# Patient Record
Sex: Female | Born: 1955 | Race: White | Hispanic: No | Marital: Married
Health system: Midwestern US, Community
[De-identification: ages and names within clinical notes are randomized; demographics above are authoritative.]

## PROBLEM LIST (undated history)

## (undated) DIAGNOSIS — K219 Gastro-esophageal reflux disease without esophagitis: Secondary | ICD-10-CM

## (undated) DIAGNOSIS — N951 Menopausal and female climacteric states: Secondary | ICD-10-CM

## (undated) DIAGNOSIS — I10 Essential (primary) hypertension: Secondary | ICD-10-CM

## (undated) DIAGNOSIS — Z0000 Encounter for general adult medical examination without abnormal findings: Secondary | ICD-10-CM

## (undated) DIAGNOSIS — Z202 Contact with and (suspected) exposure to infections with a predominantly sexual mode of transmission: Secondary | ICD-10-CM

## (undated) DIAGNOSIS — B9689 Other specified bacterial agents as the cause of diseases classified elsewhere: Secondary | ICD-10-CM

## (undated) DIAGNOSIS — M25512 Pain in left shoulder: Secondary | ICD-10-CM

## (undated) DIAGNOSIS — K529 Noninfective gastroenteritis and colitis, unspecified: Secondary | ICD-10-CM

## (undated) DIAGNOSIS — J019 Acute sinusitis, unspecified: Secondary | ICD-10-CM

## (undated) DIAGNOSIS — Q211 Atrial septal defect, unspecified: Secondary | ICD-10-CM

## (undated) DIAGNOSIS — R233 Spontaneous ecchymoses: Secondary | ICD-10-CM

## (undated) DIAGNOSIS — L0291 Cutaneous abscess, unspecified: Secondary | ICD-10-CM

## (undated) DIAGNOSIS — Z1231 Encounter for screening mammogram for malignant neoplasm of breast: Secondary | ICD-10-CM

## (undated) DIAGNOSIS — E876 Hypokalemia: Secondary | ICD-10-CM

---

## 2009-05-22 LAB — LIPID PANEL
Chol/HDL Ratio: 3.5
Cholesterol, Total: 235
HDL: 68 mg/dl
LDL Calculated: 133 mg/dL (ref 0–160)
Triglycerides: 170 mg/dL — AB (ref 40–160)

## 2010-01-24 NOTE — Progress Notes (Signed)
Subjective:      Patient ID: Deborah Crosby is a 54 y.o. female.    Knee Pain   Incident onset: many years ago. There was no injury mechanism. The pain is present in the left knee and right knee. The quality of the pain is described as aching. Pain severity now: moderate to severe. The pain has been intermittent since onset. She reports no foreign bodies present. Exacerbated by: activity. She has tried ice for the symptoms. The treatment provided mild relief.   Hip Pain   Incident onset: many years ago. There was no injury mechanism. The pain is present in the right hip. The quality of the pain is described as aching. Pain severity now: moderate to severe. The pain has been intermittent since onset. She reports no foreign bodies present. Exacerbated by: activity. She has tried ice and heat for the symptoms. The treatment provided mild relief.       Review of Systems    REVIEW OF SYSTEMS:   Patient seen today for exam.  Denies any problems with hearing, headaches or vision.  Denies any shortness of breath, chest pain, nausea or vomiting.  No black stool, no blood in the stool.  No heartburn.  Denies any problems with constipation or diarrhea either.  No dysuria type symptoms.  Libido is normal.  Reports no musculoskeletal or neurological concerns    Objective:   Physical Exam  O:  Alert and active female in no acute distress  HEENT:  TMs clear. Nares clear, no drainage noted.  Neck supple   HEART:  RRR without murmur  LUNGS:  Clear to auscultation   ABDOMEN:  Soft x4.  Bowel sounds positive.  Negative tenderness, guarding or rebound.    EXTR:  Without edema.    Neurologic exam unremarkable.  DTRs in upper and lower extremities within normal limits.  Full strength.     Assessment:      Encounter Diagnoses   Name Primary?   ??? Hypertension    ??? GERD (gastroesophageal reflux disease)    ??? Migraine headache    ??? Other screening mammogram           Plan:      Medical Decision Making:  Orders Placed This Encounter    Procedures   ??? Mammography digital screen bilateral     Order Specific Question:  Reason for exam:     Answer:  screen

## 2010-03-07 MED ORDER — ATENOLOL-CHLORTHALIDONE 50-25 MG PO TABS
50-25 MG | ORAL_TABLET | Freq: Every day | ORAL | Status: DC
Start: 2010-03-07 — End: 2010-12-26

## 2010-03-07 MED ORDER — SIMVASTATIN 40 MG PO TABS
40 MG | ORAL_TABLET | Freq: Every day | ORAL | Status: DC
Start: 2010-03-07 — End: 2010-12-26

## 2010-03-07 MED ORDER — CYCLOBENZAPRINE HCL 10 MG PO TABS
10 MG | ORAL_TABLET | Freq: Three times a day (TID) | ORAL | Status: DC | PRN
Start: 2010-03-07 — End: 2010-12-26

## 2010-03-07 MED ORDER — SUMATRIPTAN SUCCINATE 100 MG PO TABS
100 | ORAL_TABLET | ORAL | Status: DC | PRN
Start: 2010-03-07 — End: 2010-12-26

## 2010-03-07 MED ORDER — DICLOFENAC SODIUM 75 MG PO TBEC
75 MG | ORAL_TABLET | Freq: Two times a day (BID) | ORAL | Status: DC
Start: 2010-03-07 — End: 2012-07-16

## 2010-03-07 MED ORDER — TRAMADOL HCL 50 MG PO TABS
50 MG | ORAL_TABLET | ORAL | Status: DC
Start: 2010-03-07 — End: 2010-12-26

## 2010-03-07 MED ORDER — METHYLPREDNISOLONE (PAK) 4 MG PO TABS
4 MG | ORAL_TABLET | ORAL | Status: AC
Start: 2010-03-07 — End: 2010-03-13

## 2010-03-07 NOTE — Progress Notes (Signed)
Subjective:      Patient ID: Deborah Crosby is a 54 y.o. female.    Breathing Problem  She complains of difficulty breathing. Primary symptoms comments: Pain in upper back when taking in deep breath. This is a new problem. Episode onset: 3 days ago. The problem occurs constantly. The problem has been unchanged. Her symptoms are aggravated by lying down.     HAD THIS IN PAST AND WAS DIAGNOSED WITH PLEURISY.  No pain with rest or shallow breathing.  Worse with cough and deep breath.      Taking medication as discussed. Refills given.  Review of Systems    Objective:   Physical Exam   HENT:   Nose: Nose normal.   Mouth/Throat: Oropharynx is clear and moist.   Eyes: Conjunctivae are normal. Pupils are equal, round, and reactive to light.   Neck: Neck supple.   Cardiovascular: Normal rate, regular rhythm and normal heart sounds.    No murmur heard.  Pulmonary/Chest: Breath sounds normal. She has no wheezes. She has no rales. She exhibits tenderness.        Tender left mid back radiating bilaterally but most on left side.  No rash.     Abdominal: Soft. She exhibits no mass. No tenderness.   Musculoskeletal: She exhibits no edema.        Lumbar back: She exhibits decreased range of motion and pain. She exhibits no bony tenderness.   Lymphadenopathy:     She has no cervical adenopathy.       Assessment:      Encounter Diagnoses   Name Primary?   ??? Hypertension    ??? GERD (gastroesophageal reflux disease)    ??? Migraine headache    ??? Pleurisy Yes           Plan:      Orders Placed This Encounter   Medications   ??? simvastatin (ZOCOR) 40 MG tablet     Sig: Take 1 tablet by mouth daily.     Dispense:  90 tablet     Refill:  2   ??? sumatriptan (IMITREX) 100 MG tablet     Sig: Take 1 tablet by mouth as needed.     Dispense:  27 tablet     Refill:  2   ??? cyclobenzaprine (FLEXERIL) 10 MG tablet     Sig: Take 1 tablet by mouth 3 times daily as needed.     Dispense:  270 tablet     Refill:  2   ??? diclofenac (VOLTAREN) 75 MG EC tablet      Sig: Take 1 tablet by mouth 2 times daily.     Dispense:  180 tablet     Refill:  2   ??? tramadol (ULTRAM) 50 MG tablet     Sig: 1-2 po q 6-8?? prn     Dispense:  360 tablet     Refill:  2   ??? atenolol-chlorthalidone (TENORETIC) 50-25 MG per tablet     Sig: Take 1 tablet by mouth daily.     Dispense:  90 tablet     Refill:  2   ??? methylPREDNIsolone (MEDROL DOSPACK) 4 MG tablet     Sig: Take as directed per package instructions     Dispense:  21 tablet     Refill:  0     Take medication as prescribed.  Avoid activity that exacerbates symptoms.   Call or return to office as needed if these symptoms worsen or fail  to improve as anticipated.  Follow up as instructed.  Call if any problems.   CPE in the spring.

## 2010-03-29 MED ORDER — HYDROCODONE-ACETAMINOPHEN 5-500 MG PO TABS
5-500 MG | ORAL_TABLET | Freq: Four times a day (QID) | ORAL | Status: AC | PRN
Start: 2010-03-29 — End: 2010-05-08

## 2010-03-29 NOTE — Telephone Encounter (Signed)
Prescription called to pharmacy voicemail  Patient aware.

## 2010-03-29 NOTE — Telephone Encounter (Signed)
Last seen 03-07-10 for knee and hip pain. Was given Medrol dose pak , anti inflammatory and muscle relaxer and does not seem to be helping. Can you Rx some type of pain medication ?

## 2010-11-26 MED ORDER — PHENTERMINE HCL 37.5 MG PO CAPS
37.5 MG | ORAL_CAPSULE | Freq: Every morning | ORAL | Status: DC
Start: 2010-11-26 — End: 2010-12-26

## 2010-11-26 NOTE — Patient Instructions (Addendum)
Thank you for enrolling in MyChart. Please follow the instructions below to securely access your online medical record. MyChart allows you to send messages to your doctor, view your test results, renew your prescriptions, schedule appointments, and more.     How Do I Sign Up?  1. In your Internet browser, go to [email or web address]  2. Click on the Sign Up Now link in the Sign In box. You will see the New Member Sign Up page.  3. Enter your MyChart Access Code exactly as it appears below. You will not need to use this code after you've completed the sign-up process. If you do not sign up before the expiration date, you must request a new code.  MyChart Access Code: [email or web address]  Expires: 01/25/2011 10:43 AM    4. Enter your Social Security Number ([id number]) and Date of Birth (mm/dd/yyyy) as indicated and click Submit. You will be taken to the next sign-up page.  5. Create a MyChart ID. This will be your MyChart login ID and cannot be changed, so think of one that is secure and easy to remember.  6. Create a MyChart password. You can change your password at any time.  7. Enter your Password Reset Question and Answer. This can be used at a later time if you forget your password.   8. Enter your e-mail address. You will receive e-mail notification when new information is available in MyChart.  9. Click Sign Up. You can now view your medical record.     Additional Information  If you have questions, please contact your physician practice where you receive care. Remember, MyChart is NOT to be used for urgent needs. For medical emergencies, dial 911.      1800 Calorie Daily Diet Sample  Breakfast  1 cup Milk (skim)  1 c Strawberries (fresh)  1 Bagel (whole wheat or bran)  2 tbsp Cream Cheese (fat free)  0.1 c raisins (seedless)    Snack #1  1 medium Apple  0.25 cup whole Almonds    Lunch 1 cup Cranberry Juice (light)  1 Whole Wheat Pita  3 oz. Light Tuna (in water)  1 tbsp Mayo (light)  0.25 cup  sliced Tomato  0.2 cup Spinach Leaves (raw)  1 medium Orange    Snack #2  1 cup cauliflower (fresh, raw)  1 tbsp veggie dip     Dinner  4 oz. Chicken Breast (white meat)  1 tbsp Lemon Juice (fresh)  1 tbsp Ground Pepper (fresh)  1 small Sweet Potato (baked)  1 small Salad         - 0.2 cup cucumber        - 0.2 cup diced tomato        - 0.2 cup diced sweet peppers        - 1 tbsp lemon juice        - 1 tsp parsley (dried)  1 small Whole Wheat Roll    Snack #3   1 cup Plain, Non fat Yogurt  1 cup Blueberries  [email or web address]/meal_plans/

## 2010-11-26 NOTE — Progress Notes (Signed)
Subjective:      Patient ID: Deborah Crosby is a 55 y.o. female.    HPI  Presents today for follow-up on her lesion on her face  Patient was referred to Dr.Warner to have it removed, had a surgery date set then that office called stating that he won't remove it if it is going to leave a scar  Patient would like to know the next step to have it removed   Taking current medications which were reviewed.  Problem list discussed.    Overall doing well.  Has 3 new problem / question. Patient had a cramp in her right calve last week that woke her up, since then she is still having pain/ tightness in her calve. Unable to completely straighten it.     Patient would like to discuss something for weight loss.    Patient would like to discuss Imitrex, states she has been on it for years and is unsure if it helps anymore.      Review of SystemsDenies fever, chills, chest pain, and shortness of breath      Objective:   Physical Exam   HENT:   Nose: Nose normal.   Mouth/Throat: Oropharynx is clear and moist.   Eyes: Conjunctivae are normal. Pupils are equal, round, and reactive to light.   Neck: Neck supple.   Cardiovascular: Normal rate, regular rhythm and normal heart sounds.    No murmur heard.  Pulmonary/Chest: Breath sounds normal. She has no wheezes.   Abdominal: Soft. She exhibits no mass. There is no tenderness.   Musculoskeletal: She exhibits no edema and no tenderness.   Lymphadenopathy:     She has no cervical adenopathy.   Skin: No rash noted.        2 cm seb keratosis lesion       Assessment:      Encounter Diagnoses   Name Primary?   . Need for influenza vaccination    . Hypertension    . GERD (gastroesophageal reflux disease)    . Seborrheic keratosis Yes   . Obesity, unspecified          Plan:      Orders Placed This Encounter   Procedures   . Flu Vaccine greater than or = 57yo IM      Orders Placed This Encounter   Medications   . DISCONTD: phentermine (ADIPEX-P) 37.5 MG capsule     Sig: Take 1 capsule by mouth  every morning for 30 days.     Dispense:  30 capsule     Refill:  0     Take medication as prescribed.  Risks and benefits of adipex discussed in great detail  Will hold off on face lesion.  OTC QUININE and stretching for leg cramps  Discontinue imitrex  The importance of a good diet and exercise regimen discussed.  Follow up as instructed.  Call if any problems.

## 2010-12-26 MED ORDER — TRAMADOL HCL 50 MG PO TABS
50 MG | ORAL_TABLET | ORAL | Status: DC
Start: 2010-12-26 — End: 2012-05-03

## 2010-12-26 MED ORDER — DICLOFENAC SODIUM 75 MG PO TBEC
75 MG | ORAL_TABLET | Freq: Two times a day (BID) | ORAL | Status: DC
Start: 2010-12-26 — End: 2011-02-07

## 2010-12-26 MED ORDER — CYCLOBENZAPRINE HCL 10 MG PO TABS
10 MG | ORAL_TABLET | Freq: Three times a day (TID) | ORAL | Status: DC | PRN
Start: 2010-12-26 — End: 2012-07-16

## 2010-12-26 MED ORDER — ATENOLOL-CHLORTHALIDONE 50-25 MG PO TABS
50-25 MG | ORAL_TABLET | Freq: Every day | ORAL | Status: DC
Start: 2010-12-26 — End: 2011-11-24

## 2010-12-26 MED ORDER — SUMATRIPTAN SUCCINATE 100 MG PO TABS
100 | ORAL_TABLET | ORAL | Status: DC | PRN
Start: 2010-12-26 — End: 2012-03-24

## 2010-12-26 MED ORDER — PHENTERMINE HCL 37.5 MG PO CAPS
37.5 MG | ORAL_CAPSULE | Freq: Every morning | ORAL | Status: AC
Start: 2010-12-26 — End: 2011-01-25

## 2010-12-26 NOTE — Progress Notes (Signed)
In for weight and BP check for second month of Adipex.  Down 9 lbs.  Also needs refills on all meds because now has to mail away to [facility].

## 2011-02-07 MED ORDER — BETAMETHASONE DIPROPIONATE AUG 0.05 % EX CREA
0.05 % | CUTANEOUS | Status: AC
Start: 2011-02-07 — End: 2011-03-09

## 2011-02-07 MED ORDER — PHENTERMINE HCL 37.5 MG PO CAPS
37.5 MG | ORAL_CAPSULE | Freq: Every morning | ORAL | Status: AC
Start: 2011-02-07 — End: 2011-03-09

## 2011-02-07 NOTE — Progress Notes (Signed)
Subjective:      Patient ID: Deborah Crosby is a 55 y.o. female.    HPI  Presents today for a follow-up on weight loss.   Last visit weight was 173lbs.  She has lost 9 lbs.   Taking current medications which were reviewed.  Problem list discussed.    Overall doing well.  Has 2 new problem / question.    States she has lump on the top of her head.   It has been present x 1 week.   There is a soreness to it.     Also has skin peeling on her left foot x 1 month.  There is a mild pain if the skin catches on something.   Has tried lotions, and anti-fungal cream with no relief.       Has no other questions/concerns.       Review of SystemsDenies fever, chills, chest pain, and shortness of breath    No Known Allergies  Outpatient Encounter Prescriptions as of 02/07/2011   Medication Sig Dispense Refill   . phentermine (ADIPEX-P) 37.5 MG capsule Take 1 capsule by mouth every morning for 30 days.  30 capsule  0   . augmented betamethasone dipropionate (DIPROLENE AF) 0.05 % cream Apply topically 2 times daily.  60 g  1   . SUMAtriptan (IMITREX) 100 MG tablet Take 1 tablet by mouth as needed.  27 tablet  2   . atenolol-chlorthalidone (TENORETIC) 50-25 MG per tablet Take 1 tablet by mouth daily.  90 tablet  2   . cyclobenzaprine (FLEXERIL) 10 MG tablet Take 1 tablet by mouth 3 times daily as needed.  270 tablet  2   . traMADol (ULTRAM) 50 MG tablet 1-2 po q 6-8 prn  360 tablet  2   . diclofenac (VOLTAREN) 75 MG EC tablet Take 1 tablet by mouth 2 times daily.  180 tablet  2   . Lansoprazole (PREVACID PO) Take 30 mg by mouth daily.         . Multiple Vitamin (MULTIVITAMIN PO) Take  by mouth.         . DISCONTD: diclofenac (VOLTAREN) 75 MG EC tablet Take 1 tablet by mouth 2 times daily.  180 tablet  2     History     Social History   . Marital Status: Married     Spouse Name: Safiyya Kosmalski     Number of Children: 2   . 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  Smoking status: Never Smoker    . Smokeless tobacco: Never Used   . Alcohol Use: No   . Drug Use: No   . Sexually Active: Not on file     Other Topics Concern   . Not on file     Social History Narrative   . No narrative on file     Family History   Problem Relation Age of Onset   . Cancer Mother      breast   . Heart Disease Father    . Cancer Father      colon     Past Medical History   Diagnosis Date   . Hypertension    . GERD (gastroesophageal reflux disease)    . Migraine headache    . Gout    . Migraine headache  Past Surgical History   Procedure Date   . Appendectomy    . Tonsillectomy    . Colonoscopy 09/21/2009   . Hysterectomy      partial       Objective:   Physical Exam   Eyes: Conjunctivae are normal. Pupils are equal, round, and reactive to light.   Neck: Neck supple.   Cardiovascular: Normal rate, regular rhythm and normal heart sounds.    No murmur heard.  Pulmonary/Chest: Breath sounds normal. She has no wheezes.   Abdominal: Soft. She exhibits no mass. There is no tenderness.   Musculoskeletal: She exhibits no edema.   Lymphadenopathy:     She has no cervical adenopathy.       Assessment:      Encounter Diagnoses   Name Primary?   . Obesity, unspecified Yes   . Hypertension    . Eczema            Plan:         Orders Placed This Encounter   Medications   . phentermine (ADIPEX-P) 37.5 MG capsule     Sig: Take 1 capsule by mouth every morning for 30 days.     Dispense:  30 capsule     Refill:  0     BMI: 27.36   . augmented betamethasone dipropionate (DIPROLENE AF) 0.05 % cream     Sig: Apply topically 2 times daily.     Dispense:  60 g     Refill:  1     Long talk.  Take medication as prescribed.   The importance of a good diet and exercise regimen discussed.  Need to continue diet to goal weight of 150 lbs discussed  Health maintenance issues reviewed and discussed with recommendations made  Follow up as instructed.  Call if any problems.

## 2011-05-29 MED ORDER — PREDNISONE 10 MG PO TABS
10 MG | ORAL_TABLET | ORAL | Status: AC
Start: 2011-05-29 — End: 2011-06-08

## 2011-05-29 NOTE — Progress Notes (Signed)
Subjective:      Patient ID: Deborah Crosby is a 56 y.o. female.    Wrist Pain   Chronicity: Pain in right wrist, for the last 4 days. There has been no history of extremity trauma. The problem has been unchanged. The quality of the pain is described as aching (when bending wrist the wrong way she feels a sharp pain). The pain is moderate. Associated symptoms include an inability to bear weight, a limited range of motion (right wrist and left shoulder) and stiffness (left shoulder). Pertinent negatives include no fever, itching, joint locking, joint swelling, numbness or tingling. She has tried cold (Ibuprofen) for the symptoms.   Shoulder Pain   This is a new (left shoulder pain, since this morning) problem. The problem has been unchanged. The quality of the pain is described as aching. Associated symptoms include an inability to bear weight, a limited range of motion (right wrist and left shoulder) and stiffness (left shoulder). Pertinent negatives include no fever, itching, joint locking, joint swelling, numbness or tingling. Treatments tried: Ibuprofen. The treatment provided mild relief.   Hip Pain   Incident onset: Pain in her left hip, reports no injury. The quality of the pain is described as aching and cramping. The pain is moderate. Associated symptoms include an inability to bear weight and a loss of motion. Pertinent negatives include no loss of sensation, muscle weakness, numbness or tingling. Treatments tried: Ibuprofen. The treatment provided mild relief.       Review of Systems   Constitutional: Negative for fever.   Musculoskeletal: Positive for stiffness (left shoulder).   Skin: Negative for itching.   Neurological: Negative for tingling and numbness.     No Known Allergies  Outpatient Encounter Prescriptions as of 05/29/2011   Medication Sig Dispense Refill   . predniSONE (DELTASONE) 10 MG tablet Take 6 tabs x 6 days, then 5 x 1, 4 x 1, 3 x 1, 2 x 1, 1 x 1  51 tablet  0   . SUMAtriptan (IMITREX)  100 MG tablet Take 1 tablet by mouth as needed.  27 tablet  2   . atenolol-chlorthalidone (TENORETIC) 50-25 MG per tablet Take 1 tablet by mouth daily.  90 tablet  2   . cyclobenzaprine (FLEXERIL) 10 MG tablet Take 1 tablet by mouth 3 times daily as needed.  270 tablet  2   . traMADol (ULTRAM) 50 MG tablet 1-2 po q 6-8 prn  360 tablet  2   . diclofenac (VOLTAREN) 75 MG EC tablet Take 1 tablet by mouth 2 times daily.  180 tablet  2   . Lansoprazole (PREVACID PO) Take 30 mg by mouth daily.         . Multiple Vitamin (MULTIVITAMIN PO) Take  by mouth.           No facility-administered encounter medications on file as of 05/29/2011.     History     Social History   . Marital Status: Married     Spouse Name: Nikeshia Keetch     Number of Children: 2   . 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 Smoking status: Never Smoker    . Smokeless tobacco: Never Used   . Alcohol Use: No   . Drug Use: No   . Sexually Active: Not on file  Other Topics Concern   . Not on file     Social History Narrative   . No narrative on file     Family History   Problem Relation Age of Onset   . Cancer Mother      breast   . Heart Disease Father    . Cancer Father      colon     Past Medical History   Diagnosis Date   . Hypertension    . GERD (gastroesophageal reflux disease)    . Migraine headache    . Gout    . Migraine headache      Past Surgical History   Procedure Laterality Date   . Appendectomy     . Tonsillectomy     . Colonoscopy  09/21/2009   . Hysterectomy       partial       Objective:   Physical Exam   Constitutional: She is oriented to person, place, and time. She appears well-developed and well-nourished.   HENT:   Head: Normocephalic.   Mouth/Throat: Oropharynx is clear and moist.   Eyes: Conjunctivae and EOM are normal. Pupils are equal, round, and reactive to light.   Neck: Normal range of motion. Neck supple. No thyromegaly present.   Cardiovascular: Normal  rate, regular rhythm and intact distal pulses.  Exam reveals no gallop and no friction rub.    No murmur heard.  Pulmonary/Chest: Effort normal and breath sounds normal.   Abdominal: Soft. Bowel sounds are normal.   Musculoskeletal:        Left shoulder: She exhibits decreased range of motion and tenderness.        Right wrist: She exhibits decreased range of motion and tenderness.        Lumbar back: She exhibits tenderness and spasm.   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 Injured right wrist getting out of her truck and so she was using her left hand fo BP. Increased pain since. Right wrist has increased pain with flex/ext and tender over the dorsl radius. No snuff box pain. X-ray neg for fx.   Neurological: She is alert and oriented to person, place, and time.   Skin: Skin is warm and dry.   Psychiatric: She has a normal mood and affect. Her behavior is normal.       Assessment:      Encounter Diagnoses   Name Primary?   . Pain in right wrist Yes   . Left shoulder pain    . Left hip pain    . Tendonitis of wrist, right    . Low back pain             Plan:      Medical Decision Making:  Orders Placed This Encounter   Procedures   . XR Wrist Right Standard     Order Specific Question:  Reason for exam:     Answer:  wrist pain      Orders Placed This Encounter   Medications   . predniSONE (DELTASONE) 10 MG tablet     Sig: Take 6 tabs x 6 days, then 5 x 1, 4 x 1, 3 x 1, 2 x 1, 1 x 1     Dispense:  51 tablet     Refill:  0

## 2011-06-16 NOTE — Progress Notes (Signed)
Subjective:      Patient ID: Deborah Crosby is a 56 y.o. female.    Hip Pain   Incident onset: over 1 month ago. Injury mechanism: a slipping injury. The pain is present in the left hip. The quality of the pain is described as aching and shooting. The pain has been fluctuating since onset. The symptoms are aggravated by movement (certain positions). She has tried NSAIDs (oral narcotics, oral steroids) for the symptoms. The treatment provided mild relief.   Shoulder Pain   The pain is present in the left shoulder. This is a recurrent problem. Episode onset: over 1 month ago. There has been a history of trauma. The problem occurs intermittently. The problem has been waxing and waning. The quality of the pain is described as aching and sharp. The symptoms are aggravated by activity (certain positions). She has tried oral narcotics and NSAIDS (oral steroids) for the symptoms. The treatment provided mild relief.   Wrist Pain   This is a recurrent problem. Episode onset: over 1 month ago. There has been a history of trauma. The problem occurs intermittently. The problem has been waxing and waning. The quality of the pain is described as aching. The symptoms are aggravated by activity (certain movements). She has tried oral narcotics and NSAIDS (oral steroids) for the symptoms. The treatment provided mild relief.     Patient saw Dr. Wynetta Fines for this 2 weeks ago    Review of Systems  Patient seen today for exam.  Denies any problems with hearing, headaches or vision.  Denies any shortness of breath, chest pain, nausea or vomiting.  No black stool, no blood in the stool.  No heartburn.  Denies any problems with constipation or diarrhea either.  No dysuria type symptoms.  Libido is normal.  Reports no musculoskeletal or neurological concerns    No Known Allergies  Outpatient Encounter Prescriptions as of 06/16/2011   Medication Sig Dispense Refill   . SUMAtriptan (IMITREX) 100 MG tablet Take 1 tablet by mouth as needed.  27  tablet  2   . atenolol-chlorthalidone (TENORETIC) 50-25 MG per tablet Take 1 tablet by mouth daily.  90 tablet  2   . cyclobenzaprine (FLEXERIL) 10 MG tablet Take 1 tablet by mouth 3 times daily as needed.  270 tablet  2   . traMADol (ULTRAM) 50 MG tablet 1-2 po q 6-8 prn  360 tablet  2   . diclofenac (VOLTAREN) 75 MG EC tablet Take 1 tablet by mouth 2 times daily.  180 tablet  2   . Lansoprazole (PREVACID PO) Take 30 mg by mouth daily.         . Multiple Vitamin (MULTIVITAMIN PO) Take  by mouth.           No facility-administered encounter medications on file as of 06/16/2011.     History     Social History   . Marital Status: Married     Spouse Name: Tabbatha Bordelon     Number of Children: 2   . 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 Smoking status: Never Smoker    . Smokeless tobacco: Never Used   . Alcohol Use: No   . Drug Use: No   . Sexually Active: Not on file     Other Topics Concern   . Not on file  Social History Narrative   . No narrative on file     Family History   Problem Relation Age of Onset   . Cancer Mother      breast   . Heart Disease Father    . Cancer Father      colon     Past Medical History   Diagnosis Date   . Hypertension    . GERD (gastroesophageal reflux disease)    . Migraine headache    . Gout    . Migraine headache      Past Surgical History   Procedure Laterality Date   . Appendectomy     . Tonsillectomy     . Colonoscopy  09/21/2009   . Hysterectomy       partial         Objective:   Physical Exam  O:  Alert and active female in no acute distress  HEENT:  TMs clear. Pharynx neg. Nares clear, no drainage noted  Neck supple   HEART:  RRR without murmur  LUNGS:  Clear to auscultation bilaterally, no wheeze or rhonchi noted  THYROID: neg masses or nodularity  ABDOMEN:  Soft x4.  Bowel sounds positive.  Negative tenderness, guarding or rebound.    EXTR:  Without edema.    Neurologic exam unremarkable.  DTRs in upper  and lower extremities within normal limits.  Full strength noted    Shoulder- pos pain bilat strength intact, no rashes noted    Assessment:      Encounter Diagnoses   Name Primary?   . Other screening mammogram Yes   . Myalgia    . Arthralgia    . GERD (gastroesophageal reflux disease)    . Hypertension    . Migraine headache             Plan:        Orders Placed This Encounter   Procedures   . MAM Digital Screen Bilateral     Order Specific Question:  Reason for exam:     Answer:  screening   . CBC with Differential   . COMP METABOLIC W/ BILI PROFILE   . Lipid panel     Order Specific Question:  Is Patient Fasting?/# of Hours     Answer:  y   . Sedimentation rate, automated   . ANA      No orders of the defined types were placed in this encounter.       Health Maintenance Due   Topic Date Due   . Tetanus Vaccine Adult (11 Years And Up)  05/03/1964   . Cervical Cancer Screening  05/03/1974   . Colon Cancer Screening Colonoscopy  05/04/2003   . Breast Cancer Screening  02/05/2011     Health Maintenance reviewed - (past status of tetanus is unknown to patient), declines pap smear.

## 2011-06-20 LAB — CBC WITH DIFFERENTIAL
Basophils %: 1 %
Basophils Absolute: 0.1 10*3/uL (ref 0.0–0.2)
Eosinophils %: 2.4 %
Eosinophils Absolute: 0.1 10*3/uL (ref 0.0–0.7)
Hematocrit: 37.7 % (ref 37.0–47.0)
Hemoglobin: 12.7 g/dL (ref 12.0–16.0)
Lymphocytes %: 41.5 %
Lymphocytes Absolute: 2 10*3/uL (ref 1.0–4.8)
MCH: 30.3 pg (ref 27.0–31.3)
MCHC: 33.8 % (ref 33.0–37.0)
MCV: 89.4 fL (ref 82.0–100.0)
MPV: 9 fL (ref 7.4–10.4)
Monocytes %: 8.5 %
Monocytes Absolute: 0.4 10*3/uL (ref 0.2–0.8)
Neutrophils %: 46.6 %
Neutrophils Absolute: 2.3 10*3/uL (ref 1.4–6.5)
Platelets: 168 10*3/uL (ref 130–400)
RBC: 4.21 M/uL (ref 4.20–5.40)
RDW: 13.3 % (ref 11.5–14.5)
WBC: 4.9 10*3/uL (ref 4.8–10.8)

## 2011-06-20 LAB — COMPREHENSIVE METABOLIC PANEL
ALT: 21 U/L (ref 0–63)
AST: 23 U/L (ref 0–35)
Albumin: 4.1 g/dL (ref 3.5–5.2)
Alkaline Phosphatase: 55 U/L (ref 40–135)
Anion Gap: 8 mEq/L (ref 7–13)
BUN: 28 mg/dL — ABNORMAL HIGH (ref 10–26)
CO2: 30 mEq/L (ref 22–32)
Calcium: 8.6 mg/dL (ref 8.5–10.2)
Chloride: 106 mEq/L (ref 99–111)
Creatinine: 0.9 mg/dL (ref 0.50–1.10)
GFR African American: >60 (ref 60.0–999.0)
GFR Non-African American: >60 (ref >60)
Globulin: 2.6 g/dL (ref 2.3–3.5)
Glucose: 89 mg/dL (ref 60–115)
Potassium: 4 mEq/L (ref 3.5–5.5)
Sodium: 144 mEq/L (ref 135–146)
Total Bilirubin: 0.2 mg/dL (ref 0.2–1.1)
Total Protein: 6.7 g/dL (ref 6.4–8.1)

## 2011-06-20 LAB — SEDIMENTATION RATE: Sed Rate: 14 mm (ref 0–30)

## 2011-06-20 LAB — LIPID PANEL
Cholesterol, Total: 221 mg/dL — ABNORMAL HIGH (ref 0–199)
HDL: 74 mg/dL — ABNORMAL HIGH (ref 40–59)
LDL Calculated: 108 mg/dL
Triglycerides: 193 mg/dL — ABNORMAL HIGH (ref 0–149)

## 2011-06-23 LAB — ANA: ANA: NEGATIVE

## 2011-06-25 NOTE — Progress Notes (Signed)
Results for the following test have been finalized and entered into the patients chart

## 2011-10-13 MED ORDER — ATENOLOL 50 MG PO TABS
50 MG | ORAL_TABLET | Freq: Every day | ORAL | Status: DC
Start: 2011-10-13 — End: 2012-05-03

## 2011-10-13 MED ORDER — PHENTERMINE HCL 37.5 MG PO CAPS
37.5 MG | ORAL_CAPSULE | Freq: Every morning | ORAL | Status: DC
Start: 2011-10-13 — End: 2011-11-24

## 2011-10-13 MED ORDER — CEPHALEXIN 500 MG PO CAPS
500 MG | ORAL_CAPSULE | Freq: Two times a day (BID) | ORAL | Status: AC
Start: 2011-10-13 — End: 2011-10-23

## 2011-10-13 NOTE — Progress Notes (Signed)
Subjective:      Patient ID: Deborah Crosby is a 56 y.o. female.    HPI  Presents today complaining of Neck pain for 7 days.  This is gradually improving.  The pain is mild to moderate at times.   Pain localizes to the right side of the neck near her carotid artery.  States the pain goes "deep", and it hurts to swallow.    Negative trauma / injury.  Negative swelling.  Medication tried prior to visit : Nothing.       Also takes Tenoretic 50-25 MG. She would like to discuss just being on straight Atenolol.         Would like to discuss going on Adipex.  Wants to lose 30 pounds.  Has tried to diet in the past with no relief.          Wt Readings from Last 3 Encounters:   10/13/11 173 lb 9.6 oz (78.744 kg)   06/16/11 167 lb 6 oz (75.921 kg)   05/29/11 165 lb (74.844 kg)           Has no other questions or concerns          Review of Systems Denies fever, chills, chest pain, and shortness of breath  Denies nausea, vomiting, and diarrhea.    No Known Allergies  Outpatient Encounter Prescriptions as of 10/13/2011   Medication Sig Dispense Refill   ??? phentermine (ADIPEX-P) 37.5 MG capsule Take 1 capsule by mouth every morning for 30 days.  30 capsule  0   ??? atenolol (TENORMIN) 50 MG tablet Take 1 tablet by mouth daily.  30 tablet  2   ??? cephALEXin (KEFLEX) 500 MG capsule Take 1 capsule by mouth 2 times daily for 10 days.  20 capsule  0   ??? SUMAtriptan (IMITREX) 100 MG tablet Take 1 tablet by mouth as needed.  27 tablet  2   ??? atenolol-chlorthalidone (TENORETIC) 50-25 MG per tablet Take 1 tablet by mouth daily.  90 tablet  2   ??? cyclobenzaprine (FLEXERIL) 10 MG tablet Take 1 tablet by mouth 3 times daily as needed.  270 tablet  2   ??? traMADol (ULTRAM) 50 MG tablet 1-2 po q 6-8?? prn  360 tablet  2   ??? diclofenac (VOLTAREN) 75 MG EC tablet Take 1 tablet by mouth 2 times daily.  180 tablet  2   ??? Lansoprazole (PREVACID PO) Take 30 mg by mouth daily.         ??? Multiple Vitamin (MULTIVITAMIN PO) Take  by mouth.           No  facility-administered encounter medications on file as of 10/13/2011.     History     Social History   ??? Marital Status: Married     Spouse Name: Magda Muise     Number of Children: 2   ??? Years of Education: N/A     Occupational History   ??? 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 Smoking status: Never Smoker    ??? Smokeless tobacco: Never Used   ??? Alcohol Use: No   ??? Drug Use: No   ??? Sexually Active: Not on file     Other Topics Concern   ??? Not on file     Social History Narrative   ??? No narrative on file     Family History   Problem Relation  Age of Onset   ??? Cancer Mother      breast   ??? Heart Disease Father    ??? Cancer Father      colon     Past Medical History   Diagnosis Date   ??? Hypertension    ??? GERD (gastroesophageal reflux disease)    ??? Migraine headache    ??? Gout    ??? Migraine headache      Past Surgical History   Procedure Laterality Date   ??? Appendectomy     ??? Tonsillectomy     ??? Colonoscopy  09/21/2009   ??? Hysterectomy       partial   ??? Upper gastrointestinal endoscopy  09/21/2009       Objective:   Physical Exam   HENT:   Nose: Nose normal.   Mouth/Throat: Oropharynx is clear and moist.   Eyes: Conjunctivae are normal. Pupils are equal, round, and reactive to light.   Neck: Neck supple. No thyromegaly present.   1 tender lymph node.r low anterior neck   Cardiovascular: Normal rate, regular rhythm and normal heart sounds.    No murmur heard.  Pulmonary/Chest: Breath sounds normal. She has no wheezes.   Abdominal: Soft. She exhibits no mass. There is no tenderness.   Musculoskeletal: She exhibits no edema.   Skin: Rash noted.       Assessment:      Encounter Diagnoses   Name Primary?   ??? Hypertension Yes   ??? Migraine headache    ??? Overweight    ??? Lymphadenitis             Plan:         Orders Placed This Encounter   Medications   ??? phentermine (ADIPEX-P) 37.5 MG capsule     Sig: Take 1 capsule by mouth every morning for 30 days.     Dispense:  30 capsule     Refill:  0     BMI 28.89   ???  atenolol (TENORMIN) 50 MG tablet     Sig: Take 1 tablet by mouth daily.     Dispense:  30 tablet     Refill:  2   ??? cephALEXin (KEFLEX) 500 MG capsule     Sig: Take 1 capsule by mouth 2 times daily for 10 days.     Dispense:  20 capsule     Refill:  0     Take medication as prescribed.  Add 1/2 extra tenormin daily  Call or return to office as needed if these symptoms worsen or fail to improve as anticipated.  Take medication as prescribed.  Health maintenance issues reviewed and discussed with recommendations made

## 2011-10-16 NOTE — Telephone Encounter (Signed)
Spoke with Misty Stanley - Pharmacy at [facility] - added hyperlipidemia.    LMOM for patient that it was added.

## 2011-10-16 NOTE — Telephone Encounter (Signed)
OK TO CALL AND LET THEM KNOW  SHE HAS HYPERLIPIDEMIA ALSO SO THEY CAN FILL

## 2011-10-16 NOTE — Telephone Encounter (Signed)
PATIENT CALLING IN ABOUT ADIPEX PRESCRIPTION THAT WAS WRITTEN ON 10/13/11.  [facility] WOULD NOT FILL THIS BECAUSE HER BMI WAS NOT OVER 30 AND SHE DID NOT HAVE 2 OTHER PROBLEMS.  SHE DOES HAVE HYPERTENSION, BUT THE PHARMACY WOULD NEED [place] TO CALL THEM AND TELL THEM SHE HAS HYPERLIPIDEMIA HAS WELL BEFORE SHE CAN GET RX FILLED.  OK TO CALL PHARMACY?  PLEASE ADVISE.    [facility] [place] [facility].

## 2011-11-12 NOTE — Progress Notes (Signed)
This encounter was created in error - please disregard.

## 2011-11-17 NOTE — Progress Notes (Deleted)
Subjective:      Patient ID: Deborah Crosby is a 56 y.o. female.    HPI  Presents today for a follow-up on Weight Loss.  Is taking Adipex.   Has no concerns with this medication.   Taking current medications which were reviewed.  Problem list discussed.    Overall doing well.  Has no new problem / question.      Health Maintenance Is Up-To-Date      Wt Readings from Last 3 Encounters:   10/13/11 173 lb 9.6 oz (78.744 kg)   06/16/11 167 lb 6 oz (75.921 kg)   05/29/11 165 lb (74.844 kg)       BP Readings from Last 3 Encounters:   10/13/11 108/76   06/16/11 110/74   05/29/11 118/72         Review of Systems  No Known Allergies  Outpatient Encounter Prescriptions as of 11/17/2011   Medication Sig Dispense Refill   ??? atenolol (TENORMIN) 50 MG tablet Take 1 tablet by mouth daily.  30 tablet  2   ??? SUMAtriptan (IMITREX) 100 MG tablet Take 1 tablet by mouth as needed.  27 tablet  2   ??? atenolol-chlorthalidone (TENORETIC) 50-25 MG per tablet Take 1 tablet by mouth daily.  90 tablet  2   ??? cyclobenzaprine (FLEXERIL) 10 MG tablet Take 1 tablet by mouth 3 times daily as needed.  270 tablet  2   ??? traMADol (ULTRAM) 50 MG tablet 1-2 po q 6-8?? prn  360 tablet  2   ??? diclofenac (VOLTAREN) 75 MG EC tablet Take 1 tablet by mouth 2 times daily.  180 tablet  2   ??? Lansoprazole (PREVACID PO) Take 30 mg by mouth daily.         ??? Multiple Vitamin (MULTIVITAMIN PO) Take  by mouth.           No facility-administered encounter medications on file as of 11/17/2011.     History     Social History   ??? Marital Status: Married     Spouse Name: Jamilette Colyar     Number of Children: 2   ??? Years of Education: N/A     Occupational History   ??? 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 Smoking status: Never Smoker    ??? Smokeless tobacco: Never Used   ??? Alcohol Use: No   ??? Drug Use: No   ??? Sexually Active: Not on file     Other Topics Concern   ??? Not on file     Social History Narrative   ??? No narrative on file     Family History    Problem Relation Age of Onset   ??? Cancer Mother      breast   ??? Heart Disease Father    ??? Cancer Father      colon     Past Medical History   Diagnosis Date   ??? Hypertension    ??? GERD (gastroesophageal reflux disease)    ??? Migraine headache    ??? Gout    ??? Migraine headache      Past Surgical History   Procedure Laterality Date   ??? Appendectomy     ??? Tonsillectomy     ??? Colonoscopy  09/21/2009   ??? Hysterectomy       partial   ??? Upper gastrointestinal endoscopy  09/21/2009  Objective:   Physical Exam    Assessment:      Encounter Diagnosis   Name Primary?   ??? Overweight Yes            Plan:      No orders of the defined types were placed in this encounter.      No orders of the defined types were placed in this encounter.

## 2011-11-18 NOTE — Progress Notes (Signed)
This encounter was created in error - please disregard.

## 2011-11-24 MED ORDER — PHENTERMINE HCL 37.5 MG PO CAPS
37.5 MG | ORAL_CAPSULE | Freq: Every morning | ORAL | Status: AC
Start: 2011-11-24 — End: 2011-12-24

## 2011-11-24 MED ORDER — ATENOLOL-CHLORTHALIDONE 50-25 MG PO TABS
50-25 MG | ORAL_TABLET | Freq: Every day | ORAL | Status: DC
Start: 2011-11-24 — End: 2012-12-15

## 2011-11-24 NOTE — Progress Notes (Signed)
Subjective:      Patient ID: Deborah Crosby is a 56 y.o. female.    HPI  Presents today for a follow-up on Weight Loss.  Is on her 2nd month of Adipex.    Taking current medications which were reviewed.  Problem list discussed.    Overall doing well.  Has 2 new problem / question.    Needs FMLA Paperwork filled out.     Would also like a Tetanus Vaccine today.       Wt Readings from Last 3 Encounters:   11/24/11 170 lb 9.6 oz (77.384 kg)   10/13/11 173 lb 9.6 oz (78.744 kg)   06/16/11 167 lb 6 oz (75.921 kg)       Health Maintenance Is Up-To-Date            Review of Systems   Constitutional: Negative for fever and chills.   HENT: Negative for ear pain, sore throat, trouble swallowing, neck pain and sinus pressure.    Eyes: Negative.    Respiratory: Negative for cough, shortness of breath and wheezing.    Cardiovascular: Negative for chest pain and palpitations.   Gastrointestinal: Negative for abdominal pain, diarrhea and constipation.   Genitourinary: Negative for dysuria and frequency.   Skin: Negative for rash.   Psychiatric/Behavioral: Negative.      No Known Allergies  Outpatient Encounter Prescriptions as of 11/24/2011   Medication Sig Dispense Refill   ??? atenolol-chlorthalidone (TENORETIC) 50-25 MG per tablet Take 1 tablet by mouth daily.  90 tablet  3   ??? phentermine (ADIPEX-P) 37.5 MG capsule Take 1 capsule by mouth every morning for 30 days.  30 capsule  0   ??? atenolol (TENORMIN) 50 MG tablet Take 1 tablet by mouth daily.  30 tablet  2   ??? SUMAtriptan (IMITREX) 100 MG tablet Take 1 tablet by mouth as needed.  27 tablet  2   ??? cyclobenzaprine (FLEXERIL) 10 MG tablet Take 1 tablet by mouth 3 times daily as needed.  270 tablet  2   ??? traMADol (ULTRAM) 50 MG tablet 1-2 po q 6-8?? prn  360 tablet  2   ??? diclofenac (VOLTAREN) 75 MG EC tablet Take 1 tablet by mouth 2 times daily.  180 tablet  2   ??? Lansoprazole (PREVACID PO) Take 30 mg by mouth daily.         ??? Multiple Vitamin (MULTIVITAMIN PO) Take  by mouth.          ??? DISCONTD: atenolol-chlorthalidone (TENORETIC) 50-25 MG per tablet Take 1 tablet by mouth daily.  90 tablet  2     No facility-administered encounter medications on file as of 11/24/2011.     History     Social History   ??? Marital Status: Married     Spouse Name: Sherine Orellana     Number of Children: 2   ??? Years of Education: N/A     Occupational History   ??? 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 Smoking status: Never Smoker    ??? Smokeless tobacco: Never Used   ??? Alcohol Use: No   ??? Drug Use: No   ??? Sexually Active: Not on file     Other Topics Concern   ??? Not on file     Social History Narrative   ??? No narrative on file     Family History   Problem Relation Age  of Onset   ??? Cancer Mother      breast   ??? Heart Disease Father    ??? Cancer Father      colon     Past Medical History   Diagnosis Date   ??? Hypertension    ??? GERD (gastroesophageal reflux disease)    ??? Migraine headache    ??? Gout    ??? Migraine headache      Past Surgical History   Procedure Laterality Date   ??? Appendectomy     ??? Tonsillectomy     ??? Colonoscopy  09/21/2009   ??? Hysterectomy       partial   ??? Upper gastrointestinal endoscopy  09/21/2009       Objective:   Physical Exam   HENT:   Nose: Nose normal.   Mouth/Throat: Oropharynx is clear and moist.   Eyes: Conjunctivae are normal. Pupils are equal, round, and reactive to light.   Neck: Neck supple.   Cardiovascular: Normal rate, regular rhythm and normal heart sounds.    No murmur heard.  Pulmonary/Chest: Breath sounds normal. She has no wheezes.   Abdominal: Soft. She exhibits no mass. There is no tenderness.   Musculoskeletal: She exhibits no edema.   Lymphadenopathy:     She has no cervical adenopathy.   Neurological: She is alert.   Skin: No rash noted.       Assessment:      Encounter Diagnoses   Name Primary?   ??? Hypertension Yes   ??? Overweight             Plan:        Orders Placed This Encounter   Medications   ??? atenolol-chlorthalidone (TENORETIC) 50-25 MG per  tablet     Sig: Take 1 tablet by mouth daily.     Dispense:  90 tablet     Refill:  3   ??? phentermine (ADIPEX-P) 37.5 MG capsule     Sig: Take 1 capsule by mouth every morning for 30 days.     Dispense:  30 capsule     Refill:  0     BMI 28.39     Long talk  Take medication as prescribed.  The importance of a good diet and exercise regimen discussed.      Health maintenance issues reviewed and discussed with recommendations made  Follow up as instructed.  Call if any problems.

## 2012-03-24 MED ORDER — CEFDINIR 300 MG PO CAPS
300 MG | ORAL_CAPSULE | Freq: Every day | ORAL | Status: DC
Start: 2012-03-24 — End: 2012-03-31

## 2012-03-24 MED ORDER — SUMATRIPTAN SUCCINATE 100 MG PO TABS
100 | ORAL_TABLET | ORAL | Status: DC | PRN
Start: 2012-03-24 — End: 2012-07-16

## 2012-03-24 NOTE — Progress Notes (Signed)
Subjective:      Patient ID: Deborah Crosby is a 57 y.o. female.    Sinusitis  This is a new problem. Episode onset: 3 weeks ago. The problem has been gradually worsening since onset. Her pain is at a severity of 5/10. The pain is moderate. Associated symptoms include chills, congestion, coughing (mild phlegm), headaches, a hoarse voice, shortness of breath, sinus pressure, sneezing and a sore throat. Pertinent negatives include no ear pain or swollen glands. (Sweats, fatigue) Treatments tried: aleve, flexeril, voltaren. The treatment provided mild relief.        Review of Systems   Constitutional: Positive for chills.   HENT: Positive for congestion, sore throat, hoarse voice, sneezing and sinus pressure. Negative for ear pain.    Respiratory: Positive for cough (mild phlegm) and shortness of breath.    Neurological: Positive for headaches.       REVIEW OF SYSTEMS:   Patient seen today for exam.  Denies any problems with hearing, headaches or vision.  Denies any shortness of breath, chest pain, nausea or vomiting.  No black stool, no blood in the stool.  No heartburn.  Denies any problems with constipation or diarrhea either.  No dysuria type symptoms.  Libido is normal.  Reports no musculoskeletal or neurological concerns    No Known Allergies  Outpatient Encounter Prescriptions as of 03/24/2012   Medication Sig Dispense Refill   ??? SUMAtriptan (IMITREX) 100 MG tablet Take 1 tablet by mouth as needed.  27 tablet  2   ??? cefdinir (OMNICEF) 300 MG capsule Take 2 capsules by mouth daily for 10 days.  20 capsule  0   ??? atenolol-chlorthalidone (TENORETIC) 50-25 MG per tablet Take 1 tablet by mouth daily.  90 tablet  3   ??? atenolol (TENORMIN) 50 MG tablet Take 1 tablet by mouth daily.  30 tablet  2   ??? cyclobenzaprine (FLEXERIL) 10 MG tablet Take 1 tablet by mouth 3 times daily as needed.  270 tablet  2   ??? traMADol (ULTRAM) 50 MG tablet 1-2 po q 6-8?? prn  360 tablet  2   ??? diclofenac (VOLTAREN) 75 MG EC tablet Take 1  tablet by mouth 2 times daily.  180 tablet  2   ??? Lansoprazole (PREVACID PO) Take 30 mg by mouth daily.         ??? Multiple Vitamin (MULTIVITAMIN PO) Take  by mouth.         ??? [DISCONTINUED] SUMAtriptan (IMITREX) 100 MG tablet Take 1 tablet by mouth as needed.  27 tablet  2     No facility-administered encounter medications on file as of 03/24/2012.     History     Social History   ??? Marital Status: Married     Spouse Name: Kamrynn Melott     Number of Children: 2   ??? Years of Education: N/A     Occupational History   ??? 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 Smoking status: Never Smoker    ??? Smokeless tobacco: Never Used   ??? Alcohol Use: No   ??? Drug Use: No   ??? Sexually Active: Not on file     Other Topics Concern   ??? Not on file     Social History Narrative   ??? No narrative on file     Family History   Problem Relation Age of Onset   ??? Cancer  Mother      breast   ??? Heart Disease Father    ??? Cancer Father      colon     Past Medical History   Diagnosis Date   ??? Hypertension    ??? GERD (gastroesophageal reflux disease)    ??? Migraine headache    ??? Gout    ??? Migraine headache      Past Surgical History   Procedure Laterality Date   ??? Appendectomy     ??? Tonsillectomy     ??? Colonoscopy  09/21/2009   ??? Hysterectomy       partial   ??? Upper gastrointestinal endoscopy  09/21/2009         Objective:   Physical Exam  O:  Alert and active female in no acute distress  HEENT:  TMs clear. Pharynx neg. Nares clear,  drainage noted  Neck supple   HEART:  RRR without murmur  LUNGS:  Clear to auscultation bilaterally, no wheeze or rhonchi noted  THYROID: neg masses or nodularity  ABDOMEN:  Soft x4.  Bowel sounds positive.  Negative tenderness, guarding or rebound.    EXTR:  Without edema.          Assessment:      Encounter Diagnoses   Name Primary?   ??? Hypertension    ??? GERD (gastroesophageal reflux disease)    ??? Acute sinusitis, unspecified Yes            Plan:      No orders of the defined types were placed in  this encounter.      Orders Placed This Encounter   Medications   ??? SUMAtriptan (IMITREX) 100 MG tablet     Sig: Take 1 tablet by mouth as needed.     Dispense:  27 tablet     Refill:  2   ??? cefdinir (OMNICEF) 300 MG capsule     Sig: Take 2 capsules by mouth daily for 10 days.     Dispense:  20 capsule     Refill:  0     Health Maintenance Due   Topic Date Due   ??? Tetanus Vaccine Adult (11 Years And Up)  05[birth year]03/1964     Health Maintenance reviewed - past status of tetanus vaccine unknown to patient.

## 2012-03-31 MED ORDER — HYDROCODONE-ACETAMINOPHEN 5-325 MG PO TABS
5-325 MG | ORAL_TABLET | Freq: Four times a day (QID) | ORAL | Status: DC | PRN
Start: 2012-03-31 — End: 2012-09-14

## 2012-03-31 MED ADMIN — cyanocobalamin injection 1,000 mcg: INTRAMUSCULAR | @ 20:00:00 | NDC 00517013005

## 2012-03-31 NOTE — Progress Notes (Signed)
Subjective:      Patient ID: Deborah Crosby is a 57 y.o. female.    HPI  Presents today for a follow-up on Left Shoulder Pain.  Pain is also located in her left knee, and left hand.   Has been to Dr. Kathyrn Sheriff, and was given Injections in her Shoulder.  Went to PT several times.   Afterwards, the pain in the shoulder was worse.   Takes Flexeril 10 MG, and Ultram 50 MG.  States the Flexeril helps mildly, but the Ultram does not help.   Taking current medications which were reviewed.  Problem list discussed.    Overall doing well.  Has no new problem / question.    Health Maintenance Is Up-To-Date      Review of Systems Denies fever, chills, chest pain, and shortness of breath    BP 112/86   Pulse 68   Temp(Src) 98.6 ??F (37 ??C) (Oral)   Resp 12   Ht 5\' 5"  (1.651 m)   Wt 174 lb 12.8 oz (79.289 kg)   BMI 29.09 kg/m2   LMP 05/29/2003   Breastfeeding? No  No Known Allergies  Outpatient Encounter Prescriptions as of 03/31/2012   Medication Sig Dispense Refill   ??? HYDROcodone-acetaminophen (NORCO) 5-325 MG per tablet Take 1 tablet by mouth every 6 hours as needed for Pain for 30 days.  60 tablet  1   ??? SUMAtriptan (IMITREX) 100 MG tablet Take 1 tablet by mouth as needed.  27 tablet  2   ??? atenolol-chlorthalidone (TENORETIC) 50-25 MG per tablet Take 1 tablet by mouth daily.  90 tablet  3   ??? atenolol (TENORMIN) 50 MG tablet Take 1 tablet by mouth daily.  30 tablet  2   ??? cyclobenzaprine (FLEXERIL) 10 MG tablet Take 1 tablet by mouth 3 times daily as needed.  270 tablet  2   ??? traMADol (ULTRAM) 50 MG tablet 1-2 po q 6-8?? prn  360 tablet  2   ??? diclofenac (VOLTAREN) 75 MG EC tablet Take 1 tablet by mouth 2 times daily.  180 tablet  2   ??? Lansoprazole (PREVACID PO) Take 30 mg by mouth daily.         ??? Multiple Vitamin (MULTIVITAMIN PO) Take  by mouth.         ??? [DISCONTINUED] cefdinir (OMNICEF) 300 MG capsule Take 2 capsules by mouth daily for 10 days.  20 capsule  0   ??? [EXPIRED] cyanocobalamin injection 1,000 mcg          No  facility-administered encounter medications on file as of 03/31/2012.     History     Social History   ??? Marital Status: Married     Spouse Name: Deem Marmol     Number of Children: 2   ??? Years of Education: N/A     Occupational History   ??? 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 Smoking status: Never Smoker    ??? Smokeless tobacco: Never Used   ??? Alcohol Use: No   ??? Drug Use: No   ??? Sexually Active: Not on file     Other Topics Concern   ??? Not on file     Social History Narrative   ??? No narrative on file     Family History   Problem Relation Age of Onset   ??? Cancer Mother      breast   ???  Heart Disease Father    ??? Cancer Father      colon     Past Medical History   Diagnosis Date   ??? Hypertension    ??? GERD (gastroesophageal reflux disease)    ??? Migraine headache    ??? Gout    ??? Migraine headache      Past Surgical History   Procedure Laterality Date   ??? Appendectomy     ??? Tonsillectomy     ??? Colonoscopy  09/21/2009   ??? Hysterectomy       partial   ??? Upper gastrointestinal endoscopy  09/21/2009         Objective:   Physical Exam   Neck: No thyromegaly present.   Cardiovascular: Normal rate, regular rhythm and normal heart sounds.    Pulmonary/Chest: Effort normal and breath sounds normal.   Abdominal: She exhibits no distension.   Musculoskeletal: Normal range of motion.   ANTERIOR SHOULDER PAIN INCREASED WITH range of motion        Assessment:      Encounter Diagnoses   Name Primary?   ??? Shoulder pain, left Yes   ??? OA (osteoarthritis)    ??? Fall    ??? B12 deficiency             Plan:      Orders Placed This Encounter   Procedures   ??? MRI shoulder left without contrast     Order Specific Question:  Reason for exam:     Answer:  pain      Orders Placed This Encounter   Medications   ??? cyanocobalamin injection 1,000 mcg     Sig:    ??? HYDROcodone-acetaminophen (NORCO) 5-325 MG per tablet     Sig: Take 1 tablet by mouth every 6 hours as needed for Pain for 30 days.     Dispense:  60 tablet     Refill:   1     Long talk  Still with significant pain  FAILED REST MEDS PT AND INJECTIONS  Take medication as prescribed.  Avoid activity that exacerbates symptoms.   Further work up and treatment depending on response and test results.

## 2012-04-11 NOTE — Progress Notes (Signed)
Quick Note:    MRI SHOWS ARTHRITIS AND TENDONITIS. POSSIBLE SMALL TEAR   REFER TO ORTHO-- DR ROBERT ZANOTTI   Notify pt.  ______

## 2012-05-03 LAB — CBC WITH DIFFERENTIAL
Basophils %: 0.8 %
Basophils Absolute: 0 10*3/uL (ref 0.0–0.2)
Eosinophils %: 2.1 %
Eosinophils Absolute: 0.1 10*3/uL (ref 0.0–0.7)
Hematocrit: 36.8 % — ABNORMAL LOW (ref 37.0–47.0)
Hemoglobin: 12.2 g/dL (ref 12.0–16.0)
Lymphocytes %: 39.8 %
Lymphocytes Absolute: 2.2 10*3/uL (ref 1.0–4.8)
MCH: 29.4 pg (ref 27.0–31.3)
MCHC: 33.2 % (ref 33.0–37.0)
MCV: 88.4 fL (ref 82.0–100.0)
MPV: 9.1 fL (ref 7.4–10.4)
Monocytes %: 8.4 %
Monocytes Absolute: 0.5 10*3/uL (ref 0.2–0.8)
Neutrophils %: 48.9 %
Neutrophils Absolute: 2.7 10*3/uL (ref 1.4–6.5)
Platelets: 234 10*3/uL (ref 130–400)
RBC: 4.16 M/uL — ABNORMAL LOW (ref 4.20–5.40)
RDW: 13.1 % (ref 11.5–14.5)
WBC: 5.5 10*3/uL (ref 4.8–10.8)

## 2012-05-03 LAB — PROTIME-INR
INR: 1
Protime: 10.2 s (ref 9.7–11.4)

## 2012-05-03 LAB — COMPREHENSIVE METABOLIC PANEL
ALT: 19 U/L (ref 0–33)
AST: 25 U/L (ref 0–32)
Albumin: 4.4 g/dL (ref 3.9–4.9)
Alkaline Phosphatase: 62 U/L (ref 35–104)
Anion Gap: 8 mEq/L (ref 7–13)
BUN: 28 mg/dL — ABNORMAL HIGH (ref 6–20)
CO2: 29 mEq/L (ref 22–29)
Calcium: 9.1 mg/dL (ref 8.6–10.2)
Chloride: 102 mEq/L (ref 98–107)
Creatinine: 0.92 mg/dL — ABNORMAL HIGH (ref 0.50–0.90)
GFR African American: >60 (ref >60)
GFR Non-African American: >60 (ref >60)
Globulin: 2.5 g/dL (ref 2.3–3.5)
Glucose: 85 mg/dL (ref 74–109)
Potassium: 4.2 mEq/L (ref 3.5–5.1)
Sodium: 139 mEq/L (ref 136–145)
Total Bilirubin: 0.2 mg/dL (ref 0.0–1.2)
Total Protein: 6.9 g/dL (ref 6.4–8.3)

## 2012-05-03 LAB — LIPID PANEL
Cholesterol, Total: 209 mg/dL — ABNORMAL HIGH (ref 0–199)
HDL: 67 mg/dL
LDL Calculated: 115 mg/dL (ref 0–129)
Triglycerides: 136 mg/dL (ref 0–200)

## 2012-05-03 LAB — APTT: aPTT: 26.1 s (ref 23.5–33.0)

## 2012-05-03 MED ORDER — HYDROCODONE-IBUPROFEN 5-200 MG PO TABS
5-200 MG | ORAL_TABLET | Freq: Four times a day (QID) | ORAL | Status: AC | PRN
Start: 2012-05-03 — End: 2012-06-02

## 2012-05-03 NOTE — Progress Notes (Signed)
Subjective:      Patient ID: Deborah Crosby is a 57 y.o. female.    HPI  SHAMELL HITTLE presents for preop evaluation:     Surgeon : Dr. Kyra Manges  Type of surgery : left shoulder arthroscopic surgery  Surgery site : [facility]  Date of procedure:  05-13-12      States she is taking Norco 5-325 mg wants to discuss different pain medications to control her pain      Review of Systems   Constitutional: Negative for fever and chills.   HENT: Negative for sore throat, trouble swallowing, neck pain and sinus pressure.    Eyes: Negative.    Respiratory: Negative for cough, shortness of breath and wheezing.    Cardiovascular: Negative for chest pain and palpitations.   Gastrointestinal: Negative for abdominal pain, diarrhea and constipation.   Genitourinary: Negative for dysuria and frequency.   Skin: Negative for rash.   Psychiatric/Behavioral: Negative.         Denies any problems with hearing, headaches or vision.  Denies any shortness of breath, chest pain, nausea or vomiting.  No black stool, no blood in the stool.  No heartburn.  Denies any problems with constipation or diarrhea either.  No dysuria type symptoms.     BP 114/78   Pulse 64   Temp(Src) 97.8 ??F (36.6 ??C)   Resp 20   Ht 5\' 5"  (1.651 m)   Wt 177 lb (80.287 kg)   BMI 29.45 kg/m2   LMP 05/29/2003  No Known Allergies  Outpatient Encounter Prescriptions as of 05/03/2012   Medication Sig Dispense Refill   ??? HYDROcodone-ibuprofen (VICOPROFEN) 5-200 MG per tablet Take 1 tablet by mouth every 6 hours as needed for Pain for 30 days.  60 tablet  1   ??? SUMAtriptan (IMITREX) 100 MG tablet Take 1 tablet by mouth as needed.  27 tablet  2   ??? atenolol-chlorthalidone (TENORETIC) 50-25 MG per tablet Take 1 tablet by mouth daily.  90 tablet  3   ??? cyclobenzaprine (FLEXERIL) 10 MG tablet Take 1 tablet by mouth 3 times daily as needed.  270 tablet  2   ??? diclofenac (VOLTAREN) 75 MG EC tablet Take 1 tablet by mouth 2 times daily.  180 tablet  2   ??? Lansoprazole  (PREVACID PO) Take 30 mg by mouth daily.         ??? Multiple Vitamin (MULTIVITAMIN PO) Take  by mouth.         ??? [DISCONTINUED] atenolol (TENORMIN) 50 MG tablet Take 1 tablet by mouth daily.  30 tablet  2   ??? [DISCONTINUED] traMADol (ULTRAM) 50 MG tablet 1-2 po q 6-8?? prn  360 tablet  2     No facility-administered encounter medications on file as of 05/03/2012.     History     Social History   ??? Marital Status: Married     Spouse Name: Lynsay Fesperman     Number of Children: 2   ??? Years of Education: N/A     Occupational History   ??? 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 Smoking status: Never Smoker    ??? Smokeless tobacco: Never Used   ??? Alcohol Use: No   ??? Drug Use: No   ??? Sexually Active: Not on file     Other Topics Concern   ??? Not on file  Social History Narrative   ??? No narrative on file     Family History   Problem Relation Age of Onset   ??? Cancer Mother      breast   ??? Heart Disease Father    ??? Cancer Father      colon     Past Medical History   Diagnosis Date   ??? Hypertension    ??? GERD (gastroesophageal reflux disease)    ??? Migraine headache    ??? Gout    ??? Migraine headache      Past Surgical History   Procedure Laterality Date   ??? Appendectomy     ??? Tonsillectomy     ??? Colonoscopy  09/21/2009   ??? Hysterectomy       partial   ??? Upper gastrointestinal endoscopy  09/21/2009         Objective:   Physical Exam   Constitutional: She appears well-developed and well-nourished. No distress.   HENT:   Head: Normocephalic.   Right Ear: External ear normal.   Left Ear: External ear normal.   Nose: Nose normal.   Mouth/Throat: Oropharynx is clear and moist.   Eyes: Conjunctivae and EOM are normal. Pupils are equal, round, and reactive to light.   Neck: Normal range of motion. Neck supple. Carotid bruit is not present. No thyromegaly present.   Cardiovascular: Normal rate, regular rhythm, normal heart sounds and normal pulses.    No murmur heard.  Pulmonary/Chest: Effort normal and breath sounds  normal. She has no wheezes.   Abdominal: Soft. Bowel sounds are normal. She exhibits no mass. There is no hepatomegaly. There is no tenderness.   Musculoskeletal: She exhibits no edema.   LEFT SHOULDER PAIN WITH range of motion    Lymphadenopathy:     She has no cervical adenopathy.   Neurological: She is alert. She has normal reflexes. No cranial nerve deficit.   Skin: Skin is warm. No rash noted. No cyanosis.   Psychiatric: She has a normal mood and affect. Her speech is normal and behavior is normal.     The Ekg interpretation:     normal sinus rhythm with a rate of 63  Axis is   Left axis deviation  QTc is  normal  Intervals and Durations are unremarkable.      No specific ST-T wave changes appreciated.  No evidence of acute ischemia.   No  prior EKG in system to compare to    Assessment:      Encounter Diagnoses   Name Primary?   ??? Left shoulder pain    ??? Pre-op exam Yes   ??? Hypertension    ??? Hypercholesteremia    ??? GERD (gastroesophageal reflux disease)    ??? OA (osteoarthritis)             Plan:      Medical Decision Making:  Orders Placed This Encounter   Procedures   ??? Urine Culture     Order Specific Question:  Specify (ex-cath, midstream, cysto, etc)?     Answer:  midstream   ??? Urinalysis with microscopic     Order Specific Question:  SPECIFY(EX-CATH,MIDSTREAM,CYSTO,ETC)?     Answer:  midstream   ??? APTT     Order Specific Question:  Daily Heparin Dose?     Answer:  na   ??? Protime-INR     Order Specific Question:  Daily Coumadin Dose?     Answer:  na   ??? Comprehensive metabolic panel   ???  CBC Auto Differential   ??? Comprehensive metabolic panel   ??? CBC Auto Differential   ??? Lipid panel     Order Specific Question:  Is Patient Fasting?/# of Hours     Answer:  8   ??? EKG 12 lead unit performed     Order Specific Question:  Reason for Exam?     Answer:  Other      Orders Placed This Encounter   Medications   ??? HYDROcodone-ibuprofen (VICOPROFEN) 5-200 MG per tablet     Sig: Take 1 tablet by mouth every 6 hours as  needed for Pain for 30 days.     Dispense:  60 tablet     Refill:  1     MEDICALLY CLEAR AS LOW RISK FOR SURGERY    Health maintenance issues reviewed and discussed with recommendations made  The importance of a good diet and exercise regimen discussed.      Follow up as instructed.  Call if any problems.

## 2012-05-18 NOTE — Progress Notes (Signed)
Quick Note:    Labs WNL or stable.    Notify pt.  ______

## 2012-07-16 MED ORDER — TRAMADOL HCL 50 MG PO TABS
50 MG | ORAL_TABLET | Freq: Four times a day (QID) | ORAL | Status: AC | PRN
Start: 2012-07-16 — End: 2012-07-31

## 2012-07-16 MED ORDER — CYCLOBENZAPRINE HCL 10 MG PO TABS
10 MG | ORAL_TABLET | Freq: Three times a day (TID) | ORAL | Status: DC | PRN
Start: 2012-07-16 — End: 2013-09-30

## 2012-07-16 MED ORDER — FENTANYL 25 MCG/HR TD PT72
25 MCG/HR | MEDICATED_PATCH | TRANSDERMAL | Status: DC
Start: 2012-07-16 — End: 2012-08-10

## 2012-07-16 MED ORDER — ATENOLOL 50 MG PO TABS
50 MG | ORAL_TABLET | Freq: Every day | ORAL | Status: DC
Start: 2012-07-16 — End: 2012-09-14

## 2012-07-16 MED ORDER — SUMATRIPTAN SUCCINATE 100 MG PO TABS
100 | ORAL_TABLET | ORAL | Status: DC | PRN
Start: 2012-07-16 — End: 2013-09-30

## 2012-07-16 MED ORDER — DICLOFENAC SODIUM 75 MG PO TBEC
75 MG | ORAL_TABLET | Freq: Two times a day (BID) | ORAL | Status: DC
Start: 2012-07-16 — End: 2013-09-30

## 2012-07-16 NOTE — Progress Notes (Signed)
Subjective:      Patient ID: Deborah Crosby is a 57 y.o. female.    HPI  Presents today complaining of Shoulder pain for 1 Year.  This is gradually worsening since they increased the exercises at therapy.   Had Surgery on this May 13, 2012.  States the surgeon drilled through the bone.   The pain is moderate to severe at times.  Pain localizes to the mid upper left arm, into the entire left shoulder.   Denies trauma / injury.  Denies swelling.  Positive pain with range of motion.  Medication tried prior to visit : Percocet, and has been using Ice with moderate relief.  Would like to discuss getting off of Percocet, because she does not want to take it.   Used phentanyl patch that mom had and worked well.  PERCOCET UPSETS STOMACH    Has no other questions or concerns    Health Maintenance Is Up-To-Date     Review of Systems   Constitutional: Negative for fever, chills and fatigue.   HENT: Negative for ear pain, congestion, sore throat and sinus pressure.    Eyes: Negative for pain.   Respiratory: Negative for cough and shortness of breath.    Cardiovascular: Negative for chest pain.   Gastrointestinal: Negative for nausea, vomiting, diarrhea and constipation.   Genitourinary: Negative for difficulty urinating.     BP 122/84   Pulse 60   Temp(Src) 99.1 ??F (37.3 ??C) (Oral)   Resp 12   Ht 5\' 5"  (1.651 m)   Wt 180 lb 9.6 oz (81.92 kg)   BMI 30.05 kg/m2   LMP 05/29/2003   Breastfeeding? No  No Known Allergies  Outpatient Encounter Prescriptions as of 07/16/2012   Medication Sig Dispense Refill   ??? SUMAtriptan (IMITREX) 100 MG tablet Take 1 tablet by mouth as needed.  27 tablet  3   ??? cyclobenzaprine (FLEXERIL) 10 MG tablet Take 1 tablet by mouth 3 times daily as needed.  270 tablet  3   ??? diclofenac (VOLTAREN) 75 MG EC tablet Take 1 tablet by mouth 2 times daily.  180 tablet  3   ??? atenolol (TENORMIN) 50 MG tablet Take 1 tablet by mouth daily.  90 tablet  3   ??? [DISCONTINUED] fentaNYL (DURAGESIC) 25 MCG/HR Place 1 patch  onto the skin every 72 hours for 30 days.  10 patch  0   ??? atenolol-chlorthalidone (TENORETIC) 50-25 MG per tablet Take 1 tablet by mouth daily.  90 tablet  3   ??? Lansoprazole (PREVACID PO) Take 30 mg by mouth daily.         ??? Multiple Vitamin (MULTIVITAMIN PO) Take  by mouth.         ??? [DISCONTINUED] SUMAtriptan (IMITREX) 100 MG tablet Take 1 tablet by mouth as needed.  27 tablet  2   ??? [DISCONTINUED] cyclobenzaprine (FLEXERIL) 10 MG tablet Take 1 tablet by mouth 3 times daily as needed.  270 tablet  2   ??? [DISCONTINUED] diclofenac (VOLTAREN) 75 MG EC tablet Take 1 tablet by mouth 2 times daily.  180 tablet  2     No facility-administered encounter medications on file as of 07/16/2012.     History     Social History   ??? Marital Status: Married     Spouse Name: Brittanie Dosanjh     Number of Children: 2   ??? Years of Education: N/A     Occupational History   ??? 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 Smoking status: Never Smoker    ??? Smokeless tobacco: Never Used   ??? Alcohol Use: No   ??? Drug Use: No   ??? Sexually Active: Not on file     Other Topics Concern   ??? Not on file     Social History Narrative   ??? No narrative on file     Family History   Problem Relation Age of Onset   ??? Cancer Mother      breast   ??? Heart Disease Father    ??? Cancer Father      colon     Past Medical History   Diagnosis Date   ??? Hypertension    ??? GERD (gastroesophageal reflux disease)    ??? Migraine headache    ??? Gout    ??? Migraine headache      Past Surgical History   Procedure Laterality Date   ??? Appendectomy     ??? Tonsillectomy     ??? Colonoscopy  09/21/2009   ??? Hysterectomy       partial   ??? Upper gastrointestinal endoscopy  09/21/2009       Objective:   Physical Exam   HENT:   Nose: Nose normal.   Mouth/Throat: Oropharynx is clear and moist.   Eyes: Pupils are equal, round, and reactive to light.   Neck: No thyromegaly present.   Cardiovascular: Normal rate, regular rhythm and normal heart sounds.    Pulmonary/Chest: Effort  normal and breath sounds normal.   Abdominal: She exhibits no distension.   Musculoskeletal: Normal range of motion.   ANTERIOR SHOULDER PAIN INCREASED WITH range of motion    Lymphadenopathy:     She has no cervical adenopathy.   Skin: No rash noted.       Assessment:      Encounter Diagnoses   Name Primary?   ??? Hypertension    ??? Chronic shoulder pain, left Yes   ??? OA (osteoarthritis)    ??? GERD (gastroesophageal reflux disease)             Plan:      No orders of the defined types were placed in this encounter.      Orders Placed This Encounter   Medications   ??? SUMAtriptan (IMITREX) 100 MG tablet     Sig: Take 1 tablet by mouth as needed.     Dispense:  27 tablet     Refill:  3   ??? cyclobenzaprine (FLEXERIL) 10 MG tablet     Sig: Take 1 tablet by mouth 3 times daily as needed.     Dispense:  270 tablet     Refill:  3   ??? diclofenac (VOLTAREN) 75 MG EC tablet     Sig: Take 1 tablet by mouth 2 times daily.     Dispense:  180 tablet     Refill:  3   ??? atenolol (TENORMIN) 50 MG tablet     Sig: Take 1 tablet by mouth daily.     Dispense:  90 tablet     Refill:  3   ??? DISCONTD: fentaNYL (DURAGESIC) 25 MCG/HR     Sig: Place 1 patch onto the skin every 72 hours for 30 days.     Dispense:  10 patch     Refill:  0     Long talk  Take medication as prescribed.  Avoid activity that exacerbates  symptoms.   The importance of a good diet and exercise regimen discussed.      Follow up as instructed.  Call if any problems.

## 2012-07-16 NOTE — Telephone Encounter (Signed)
Left message that script was sent to the pharmacy.

## 2012-07-16 NOTE — Telephone Encounter (Signed)
Patient was just in to see you, but forgot to ask for Tramadol. She wants to take this instead of her other pain medications. Please advise

## 2012-07-16 NOTE — Telephone Encounter (Signed)
E prescribed med to their pharmacy.  Call pt. And inform.

## 2012-08-10 NOTE — Telephone Encounter (Signed)
Notify pt to pick up RX

## 2012-08-10 NOTE — Telephone Encounter (Signed)
Patient last seen and last filled on 07/16/12  States that she is going out of town next week and only has 2 patches left  Would like a refill on this since she will not be able to get them next week  OK to fill?

## 2012-08-11 MED ORDER — FENTANYL 25 MCG/HR TD PT72
25 MCG/HR | MEDICATED_PATCH | TRANSDERMAL | Status: DC
Start: 2012-08-11 — End: 2012-09-14

## 2012-08-12 NOTE — Telephone Encounter (Signed)
Pt aware rx is ready to be picked up and that an ID is required

## 2012-09-14 MED ORDER — FENTANYL 25 MCG/HR TD PT72
25 MCG/HR | MEDICATED_PATCH | TRANSDERMAL | Status: DC
Start: 2012-09-14 — End: 2012-10-20

## 2012-09-14 MED ORDER — ESTROGENS CONJUGATED 0.3 MG PO TABS
0.3 MG | ORAL_TABLET | Freq: Every day | ORAL | Status: DC
Start: 2012-09-14 — End: 2013-01-10

## 2012-09-14 MED ORDER — HYDROCODONE-ACETAMINOPHEN 5-325 MG PO TABS
5-325 MG | ORAL_TABLET | Freq: Four times a day (QID) | ORAL | Status: DC | PRN
Start: 2012-09-14 — End: 2013-01-08

## 2012-09-14 NOTE — Progress Notes (Signed)
Subjective:      Patient ID: Deborah Crosby is a 57 y.o. female.    HPI  Patient presents today for Hot Flashes x 3 Years.  This is gradually worsening within the last 2 Months.   LMP: 2000.  Is not on any Hormone Replacement at this time.  Victoriano Lain from [employer] with no relief.       Would like to get a refill on Norco, and discuss the Duragesic Patches.       Has no other questions or concerns     Review of Systems   Constitutional: Negative for fever, appetite change and fatigue.   HENT: Negative for ear pain, congestion, sore throat and sinus pressure.    Eyes: Negative for pain.   Respiratory: Negative for cough and shortness of breath.    Cardiovascular: Negative for chest pain.   Gastrointestinal: Negative for nausea, vomiting, diarrhea and constipation.   Genitourinary: Negative for difficulty urinating.   Psychiatric/Behavioral: Negative.      BP 120/90   Pulse 60   Temp(Src) 98.3 ??F (36.8 ??C) (Oral)   Resp 12   Ht 5\' 5"  (1.651 m)   Wt 175 lb 9.6 oz (79.652 kg)   BMI 29.22 kg/m2   LMP 05/29/2003   Breastfeeding? No  No Known Allergies  Outpatient Encounter Prescriptions as of 09/14/2012   Medication Sig Dispense Refill   ??? fentaNYL (DURAGESIC) 25 MCG/HR Place 1 patch onto the skin every 72 hours for 30 days.  10 patch  0   ??? HYDROcodone-acetaminophen (NORCO) 5-325 MG per tablet Take 1 tablet by mouth every 6 hours as needed for Pain for 30 days.  60 tablet  1   ??? estrogens, conjugated, (PREMARIN) 0.3 MG tablet Take 1 tablet by mouth daily.  30 tablet  3   ??? SUMAtriptan (IMITREX) 100 MG tablet Take 1 tablet by mouth as needed.  27 tablet  3   ??? cyclobenzaprine (FLEXERIL) 10 MG tablet Take 1 tablet by mouth 3 times daily as needed.  270 tablet  3   ??? diclofenac (VOLTAREN) 75 MG EC tablet Take 1 tablet by mouth 2 times daily.  180 tablet  3   ??? atenolol-chlorthalidone (TENORETIC) 50-25 MG per tablet Take 1 tablet by mouth daily.  90 tablet  3   ??? Lansoprazole (PREVACID PO) Take 30 mg by mouth daily.          ??? Multiple Vitamin (MULTIVITAMIN PO) Take  by mouth.         ??? [DISCONTINUED] atenolol (TENORMIN) 50 MG tablet Take 1 tablet by mouth daily.  90 tablet  3     No facility-administered encounter medications on file as of 09/14/2012.     History     Social History   ??? Marital Status: Married     Spouse Name: Jozelynn Danielson     Number of Children: 2   ??? Years of Education: N/A     Occupational History   ??? 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 Smoking status: Never Smoker    ??? Smokeless tobacco: Never Used   ??? Alcohol Use: No   ??? Drug Use: No   ??? Sexually Active: Not on file     Other Topics Concern   ??? Not on file     Social History Narrative   ??? No narrative on file  Family History   Problem Relation Age of Onset   ??? Cancer Mother      breast   ??? Heart Disease Father    ??? Cancer Father      colon     Past Medical History   Diagnosis Date   ??? Hypertension    ??? GERD (gastroesophageal reflux disease)    ??? Migraine headache    ??? Gout    ??? Migraine headache      Past Surgical History   Procedure Laterality Date   ??? Appendectomy     ??? Tonsillectomy     ??? Colonoscopy  09/21/2009   ??? Hysterectomy       partial   ??? Upper gastrointestinal endoscopy  09/21/2009       Objective:   Physical Exam   Constitutional: She appears well-nourished.   HENT:   Nose: Nose normal.   Mouth/Throat: Oropharynx is clear and moist.   Eyes: Conjunctivae are normal. Pupils are equal, round, and reactive to light.   Neck: Neck supple. No thyromegaly present.   Cardiovascular: Normal rate, regular rhythm and normal heart sounds.    No murmur heard.  Pulmonary/Chest: Breath sounds normal. She has no wheezes.   Abdominal: Soft. She exhibits no mass. There is no tenderness.   Lymphadenopathy:     She has no cervical adenopathy.   Psychiatric: She has a normal mood and affect. Her behavior is normal.       Assessment:      Encounter Diagnoses   Name Primary?   ??? Shoulder pain, left    ??? Hypertension    ??? Hot flash, menopausal  Yes   ??? OA (osteoarthritis)             Plan:         Orders Placed This Encounter   Medications   ??? fentaNYL (DURAGESIC) 25 MCG/HR     Sig: Place 1 patch onto the skin every 72 hours for 30 days.     Dispense:  10 patch     Refill:  0   ??? HYDROcodone-acetaminophen (NORCO) 5-325 MG per tablet     Sig: Take 1 tablet by mouth every 6 hours as needed for Pain for 30 days.     Dispense:  60 tablet     Refill:  1   ??? estrogens, conjugated, (PREMARIN) 0.3 MG tablet     Sig: Take 1 tablet by mouth daily.     Dispense:  30 tablet     Refill:  3   long talk  Risks and benefits of premarin  discussed in great detail    The importance of a good diet and exercise regimen discussed.      Health maintenance issues reviewed and discussed with recommendations made  Follow up as instructed.  Call if any problems.

## 2012-10-20 ENCOUNTER — Encounter

## 2012-10-20 MED ORDER — FENTANYL 25 MCG/HR TD PT72
25 MCG/HR | MEDICATED_PATCH | TRANSDERMAL | Status: DC
Start: 2012-10-20 — End: 2012-11-25

## 2012-10-20 NOTE — Telephone Encounter (Signed)
Last seen 09-14-12. Dr Illene Labrador patient

## 2012-11-25 ENCOUNTER — Encounter

## 2012-11-25 MED ORDER — FENTANYL 25 MCG/HR TD PT72
25 MCG/HR | MEDICATED_PATCH | TRANSDERMAL | Status: DC
Start: 2012-11-25 — End: 2013-02-22

## 2012-11-30 NOTE — Telephone Encounter (Signed)
Husband picked up.

## 2012-12-15 ENCOUNTER — Encounter

## 2012-12-15 MED ORDER — ATENOLOL-CHLORTHALIDONE 50-25 MG PO TABS
50-25 MG | ORAL_TABLET | Freq: Every day | ORAL | Status: DC
Start: 2012-12-15 — End: 2013-09-30

## 2012-12-20 NOTE — Progress Notes (Signed)
This encounter was created in error - please disregard.

## 2012-12-20 NOTE — Progress Notes (Deleted)
Subjective:      Patient ID: Deborah Crosby is a 57 y.o. female.    HPI  Patient presents today for a follow-up on Hot Flashes.    Last visit, prescribed Premarin 0.3 MG.  States ***.   Taking current medications which were reviewed.  Problem list discussed.    Overall doing well.  Has no new problem / question.    Health Maintenance Is Up-To-Date    Review of Systems   Constitutional: Negative for fever, appetite change and fatigue.   HENT: Negative for ear pain, congestion, sore throat and sinus pressure.    Eyes: Negative.    Respiratory: Negative for cough and shortness of breath.    Gastrointestinal: Negative for vomiting, diarrhea and constipation.   Genitourinary: Negative for difficulty urinating.   Psychiatric/Behavioral: Negative.      Ht 5\' 5"  (1.651 m)   LMP 05/29/2003  No Known Allergies  Outpatient Encounter Prescriptions as of 12/20/2012   Medication Sig Dispense Refill   ??? atenolol-chlorthalidone (TENORETIC) 50-25 MG per tablet Take 1 tablet by mouth daily.  90 tablet  3   ??? fentaNYL (DURAGESIC) 25 MCG/HR Place 1 patch onto the skin every 72 hours.  10 patch  0   ??? estrogens, conjugated, (PREMARIN) 0.3 MG tablet Take 1 tablet by mouth daily.  30 tablet  3   ??? SUMAtriptan (IMITREX) 100 MG tablet Take 1 tablet by mouth as needed.  27 tablet  3   ??? cyclobenzaprine (FLEXERIL) 10 MG tablet Take 1 tablet by mouth 3 times daily as needed.  270 tablet  3   ??? diclofenac (VOLTAREN) 75 MG EC tablet Take 1 tablet by mouth 2 times daily.  180 tablet  3   ??? Lansoprazole (PREVACID PO) Take 30 mg by mouth daily.         ??? Multiple Vitamin (MULTIVITAMIN PO) Take  by mouth.           No facility-administered encounter medications on file as of 12/20/2012.     History     Social History   ??? Marital Status: Married     Spouse Name: Marlisha Vanwyk     Number of Children: 2   ??? Years of Education: N/A     Occupational History   ??? 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 Smoking status: Never Smoker     ??? Smokeless tobacco: Never Used   ??? Alcohol Use: No   ??? Drug Use: No   ??? Sexual Activity: Not on file     Other Topics Concern   ??? Not on file     Social History Narrative     Family History   Problem Relation Age of Onset   ??? Cancer Mother      breast   ??? Heart Disease Father    ??? Cancer Father      colon     Past Medical History   Diagnosis Date   ??? Hypertension    ??? GERD (gastroesophageal reflux disease)    ??? Migraine headache    ??? Gout    ??? Migraine headache      Past Surgical History   Procedure Laterality Date   ??? Appendectomy     ??? Tonsillectomy     ??? Colonoscopy  09/21/2009   ??? Hysterectomy       partial   ??? Upper gastrointestinal endoscopy  09/21/2009  Objective:   Physical Exam    Assessment:      Encounter Diagnoses   Name Primary?   ??? Hypertension Yes   ??? GERD (gastroesophageal reflux disease)    ??? Migraine headache             Plan:      No orders of the defined types were placed in this encounter.      No orders of the defined types were placed in this encounter.

## 2013-01-08 MED ORDER — HYDROCODONE-ACETAMINOPHEN 5-325 MG PO TABS
5-325 MG | ORAL_TABLET | Freq: Four times a day (QID) | ORAL | Status: AC | PRN
Start: 2013-01-08 — End: 2013-02-07

## 2013-01-08 MED ORDER — FENTANYL 25 MCG/HR TD PT72
25 MCG/HR | MEDICATED_PATCH | TRANSDERMAL | Status: AC
Start: 2013-01-08 — End: 2013-02-07

## 2013-01-08 NOTE — Progress Notes (Signed)
Subjective:      Patient ID: Deborah Crosby is a 57 y.o. female.    HPI  Patient presents today for follow-up visit  Has a history of HTN,  GERD and Migraine headaches  Currently taking Tenoretic 50-25 mg  Denies any medication side effects  Is fasting this morning    Also here to follow up on left shoulder pain  Had surgery in March  Currently taking Fentanyl Patch as well as Norco for the pain  This is providing moderate to significant relief  Would like to discuss if she could continue taking these medications  Because she is still in a lot of pain.      Deborah Crosby complains of bilateral leg pain for one month.  This is unchanged since the onset.  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  Mild swelling.  Positive pain with range of motion.  Medication tried prior to visit : Motrin, Aleve, Flexeril, Voltaren    Treatment Adherence:   Medication compliance:  compliant all of the time  Diet compliance:  compliant most of the time  Weight trend: stable  Current exercise: walking a lot at work    [employer] Readings from Last 3 Encounters:   01/08/13 175 lb 9.6 oz (79.652 kg)   09/14/12 175 lb 9.6 oz (79.652 kg)   07/16/12 180 lb 9.6 oz (81.92 kg)       BP Readings from Last 3 Encounters:   01/08/13 110/70   09/14/12 120/90   07/16/12 122/84       Hypertension:  Home blood pressure monitoring: Yes - 120/80.  She is adherent to a low sodium diet. Patient denies chest pain, shortness of breath, lightheadedness, blurred vision, peripheral edema, dry cough and fatigue.  Antihypertensive medication side effects: no medication side effects noted.  Use of agents associated with hypertension: none.                                        Sodium (mEq/L)   Date Value   05/03/2012 139     BUN (mg/dL)   Date Value   2/95/2841 28*    Glucose  (mg/dL)   Date Value   05/31/4008 85       Potassium (mEq/L)   Date Value   05/03/2012 4.2     CREATININE (mg/dL)   Date Value   2/72/5366 0.92*           Taking current medications which were reviewed.  Problem list discussed.    Overall doing well.  Has no new problem / question.      Health Maintenance reviewed - would like flu shot, would like a mammogram referral today, .    Review of Systems   Constitutional: Negative for fever, appetite change and fatigue.   HENT: Negative for ear pain, congestion, sore throat and sinus pressure.    Eyes: Negative for pain.   Respiratory: Negative for cough and shortness of breath.    Cardiovascular: Negative for chest pain.   Gastrointestinal: Negative for nausea, vomiting, diarrhea and constipation.   Genitourinary: Negative for difficulty urinating.   Neurological: Negative for dizziness and headaches.  Objective:   Physical Exam   Constitutional: She appears well-nourished.   HENT:   Nose: Nose normal.   Mouth/Throat: Oropharynx is clear and moist.   Eyes: Conjunctivae are normal. Pupils are equal, round, and reactive to light.   Neck: Neck supple. No thyromegaly present.   Cardiovascular: Normal rate, regular rhythm and normal heart sounds.    No murmur heard.  Pulmonary/Chest: Effort normal and breath sounds normal. She has no wheezes.   Abdominal: Soft. She exhibits no distension and no mass. There is no tenderness.   Musculoskeletal: Normal range of motion. She exhibits no edema.   LOW LEG PAIN WITH AMBULATION   Lymphadenopathy:     She has no cervical adenopathy.   Neurological: She is alert.   Skin: No rash noted.   Psychiatric: Her behavior is normal.       Assessment:      Encounter Diagnoses   Name Primary?   ??? Hypertension Yes   ??? Need for influenza vaccination    ??? Other screening mammogram    ??? Shoulder pain, left    ??? Claudication             Plan:      Orders Placed This Encounter   Procedures   ??? Mammography digital screen bilateral     Order Specific  Question:  Reason for exam:     Answer:  other screening mammogram   ??? VL Arterial PVR Lower w Exercise     Order Specific Question:  Reason for exam:     Answer:  claudication   ??? Flu Vaccine greater than or = 59yo IM      Orders Placed This Encounter   Medications   ??? HYDROcodone-acetaminophen (NORCO) 5-325 MG per tablet     Sig: Take 1 tablet by mouth every 6 hours as needed for Pain for up to 30 days.     Dispense:  60 tablet     Refill:  0   ??? fentaNYL (DURAGESIC) 25 MCG/HR     Sig: Place 1 patch onto the skin every 72 hours for 30 days.     Dispense:  10 patch     Refill:  0     LONG TALK  Take medication as prescribed.  The importance of a good diet and exercise regimen discussed.      Avoid activity that exacerbates symptoms.   Follow up as instructed.  Call if any problems.   Further work up and treatment depending on response and test results.

## 2013-01-08 NOTE — Progress Notes (Signed)
Vaccine Information Sheet, "Influenza - Inactivated" OR "Live - Intranasal"  given to Harlin HeysBarbara M Dysart, or parent/legal guardian of  Harlin HeysBarbara M Kantner and verbalized understanding.    Patient responses:    Have you ever had a reaction to a flu vaccine? No  Are you able to eat eggs without adverse effects?  Yes  Do you have any current illness?  No  Have you ever had Guillian Barre Syndrome?  No    Flu vaccine given per order. Please see immunization tab.

## 2013-01-10 ENCOUNTER — Encounter

## 2013-01-10 NOTE — Telephone Encounter (Signed)
Last seen 01-08-13. Dr. Illene Labrador patient.

## 2013-01-12 MED ORDER — ESTROGENS CONJUGATED 0.3 MG PO TABS
0.3 MG | ORAL_TABLET | Freq: Every day | ORAL | Status: DC
Start: 2013-01-12 — End: 2013-02-22

## 2013-01-24 NOTE — Progress Notes (Signed)
Quick Note:    MAM WNL/STABLE. Recheck in 1 year.   Notify patient.  ______

## 2013-02-22 ENCOUNTER — Encounter

## 2013-02-22 MED ORDER — FENTANYL 25 MCG/HR TD PT72
25 MCG/HR | MEDICATED_PATCH | TRANSDERMAL | Status: DC
Start: 2013-02-22 — End: 2013-09-30

## 2013-02-22 MED ORDER — ESTROGENS CONJUGATED 0.3 MG PO TABS
0.3 MG | ORAL_TABLET | Freq: Every day | ORAL | Status: DC
Start: 2013-02-22 — End: 2013-04-13

## 2013-02-22 NOTE — Telephone Encounter (Signed)
Last seen 01-08-13

## 2013-02-23 NOTE — Telephone Encounter (Signed)
LMOM that RX is ready for pick up.

## 2013-02-23 NOTE — Telephone Encounter (Signed)
Pt picked up Duragesic rx. Called [facility]. Spoke with Ronaldo MiyamotoKyle. Called in Premarin.Pt aware.

## 2013-03-21 NOTE — Progress Notes (Signed)
Subjective:      Patient ID: Deborah Crosby is a 58 y.o. female.    HPI  Deborah Crosby presents for complete physical examination and preop evaluation:   Verify preop checklist: yes  Surgeon : Dr. Kyra MangesZanotti  Type of surgery : Right Third Trigger Finger Release  Surgery site : [facility]  Date of procedure:  03-31-13  Verified LMP: yes    Has no other questions or concerns    Health Maintenance Is Up-To-Date    [facility]  LABS AND EKG THERE      Review of Systems   Constitutional: Negative for fever, appetite change and fatigue.   HENT: Negative for congestion, ear pain, sinus pressure and sore throat.    Eyes: Negative.    Respiratory: Negative for cough and shortness of breath.    Cardiovascular: Negative for chest pain and palpitations.   Gastrointestinal: Negative for vomiting, diarrhea and constipation.   Genitourinary: Negative for difficulty urinating.   Psychiatric/Behavioral: Negative.      BP 118/86   Pulse 72   Temp(Src) 98 ??F (36.7 ??C) (Oral)   Resp 12   Ht 5\' 5"  (1.651 m)   Wt 178 lb 9.6 oz (81.012 kg)   BMI 29.72 kg/m2   LMP 05/29/2003   Breastfeeding? No  No Known Allergies  Outpatient Encounter Prescriptions as of 03/22/2013   Medication Sig Dispense Refill   ??? estrogens, conjugated, (PREMARIN) 0.3 MG tablet Take 1 tablet by mouth daily.  30 tablet  0   ??? fentaNYL (DURAGESIC) 25 MCG/HR Place 1 patch onto the skin every 72 hours.  10 patch  0   ??? atenolol-chlorthalidone (TENORETIC) 50-25 MG per tablet Take 1 tablet by mouth daily.  90 tablet  3   ??? SUMAtriptan (IMITREX) 100 MG tablet Take 1 tablet by mouth as needed.  27 tablet  3   ??? cyclobenzaprine (FLEXERIL) 10 MG tablet Take 1 tablet by mouth 3 times daily as needed.  270 tablet  3   ??? diclofenac (VOLTAREN) 75 MG EC tablet Take 1 tablet by mouth 2 times daily.  180 tablet  3   ??? Lansoprazole (PREVACID PO) Take 30 mg by mouth daily.         ??? Multiple Vitamin (MULTIVITAMIN PO) Take  by mouth.           No  facility-administered encounter medications on file as of 03/22/2013.     History     Social History   ??? Marital Status: Married     Spouse Name: Bing PlumeJames Oommen     Number of Children: 2   ??? Years of Education: N/A     Occupational History   ??? 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 Smoking status: Never Smoker    ??? Smokeless tobacco: Never Used   ??? Alcohol Use: No   ??? Drug Use: No   ??? Sexual Activity: Not on file     Other Topics Concern   ??? Not on file     Social History Narrative     Family History   Problem Relation Age of Onset   ??? Cancer Mother      breast   ??? Heart Disease Father    ??? Cancer Father      colon     Past Medical History   Diagnosis Date   ???  Hypertension    ??? GERD (gastroesophageal reflux disease)    ??? Migraine headache    ??? Gout    ??? Migraine headache      Past Surgical History   Procedure Laterality Date   ??? Appendectomy     ??? Tonsillectomy     ??? Colonoscopy  09/21/2009   ??? Hysterectomy       partial   ??? Upper gastrointestinal endoscopy  09/21/2009       Objective:   Physical Exam   Constitutional: She appears well-nourished.   HENT:   Head: Normocephalic.   Right Ear: External ear normal.   Left Ear: External ear normal.   Nose: Nose normal.   Mouth/Throat: Oropharynx is clear and moist.   Eyes: Conjunctivae are normal. Pupils are equal, round, and reactive to light.   Neck: Neck supple. No JVD present. No thyromegaly present.   Cardiovascular: Normal rate, regular rhythm and normal heart sounds.    No murmur heard.  Pulmonary/Chest: Effort normal and breath sounds normal. She has no wheezes.   Abdominal: Soft. She exhibits no distension and no mass. There is no tenderness.   Musculoskeletal: Normal range of motion. She exhibits no edema.   Right 3rd trigger finger   Lymphadenopathy:     She has no cervical adenopathy.   Neurological: She is alert. She has normal reflexes.   Skin: No rash noted.   Psychiatric: She has a normal mood and affect. Her behavior is normal.          Assessment:      Encounter Diagnoses   Name Primary?   ??? Pre-op examination    ??? Hypertension    ??? Routine general medical examination at a health care facility Yes   ??? Trigger finger             Plan:      Orders Placed This Encounter   Procedures   ??? CBC Auto Differential   ??? Comprehensive Metabolic Panel   ??? Lipid Panel     Order Specific Question:  Is Patient Fasting?/# of Hours     Answer:  8   ??? Protime-INR     Order Specific Question:  Daily Coumadin Dose?     Answer:  0   ??? APTT     Order Specific Question:  Daily Heparin Dose?     Answer:  0      MEDICALLY CLEAR AS LOW RISK FOR SURGERY    Health maintenance issues reviewed and discussed with recommendations made  The importance of a good diet and exercise regimen discussed.      Take medication as prescribed.  Follow up as instructed.  Call if any problems.

## 2013-03-22 LAB — CBC WITH DIFFERENTIAL
Basophils %: 0.7 %
Basophils Absolute: 0.1 10*3/uL (ref 0.0–0.2)
Eosinophils %: 1.9 %
Eosinophils Absolute: 0.2 10*3/uL (ref 0.0–0.7)
Hematocrit: 38.3 % (ref 37.0–47.0)
Hemoglobin: 12.6 g/dL (ref 12.0–16.0)
Lymphocytes %: 21.3 %
Lymphocytes Absolute: 2.3 10*3/uL (ref 1.0–4.8)
MCH: 29.1 pg (ref 27.0–31.3)
MCHC: 32.7 % — ABNORMAL LOW (ref 33.0–37.0)
MCV: 88.8 fL (ref 82.0–100.0)
MPV: 9.3 fL (ref 7.4–10.4)
Monocytes %: 7.2 %
Monocytes Absolute: 0.8 10*3/uL (ref 0.2–0.8)
Neutrophils %: 68.9 %
Neutrophils Absolute: 7.3 10*3/uL — ABNORMAL HIGH (ref 1.4–6.5)
Platelets: 235 10*3/uL (ref 130–400)
RBC: 4.32 M/uL (ref 4.20–5.40)
RDW: 13.1 % (ref 11.5–14.5)
WBC: 10.6 10*3/uL (ref 4.8–10.8)

## 2013-03-22 LAB — COMPREHENSIVE METABOLIC PANEL
ALT: 27 U/L (ref 0–33)
AST: 29 U/L (ref 0–35)
Albumin: 4.3 g/dL (ref 3.9–4.9)
Alkaline Phosphatase: 84 U/L (ref 40–130)
Anion Gap: 14 mEq/L — ABNORMAL HIGH (ref 7–13)
BUN: 30 mg/dL — ABNORMAL HIGH (ref 6–20)
CO2: 26 mEq/L (ref 22–29)
Calcium: 9.3 mg/dL (ref 8.6–10.2)
Chloride: 99 mEq/L (ref 98–107)
Creatinine: 0.99 mg/dL — ABNORMAL HIGH (ref 0.50–0.90)
GFR African American: >60 (ref >60)
GFR Non-African American: >60 (ref >60)
Globulin: 2.7 g/dL (ref 2.3–3.5)
Glucose: 88 mg/dL (ref 74–109)
Potassium: 4 mEq/L (ref 3.5–5.1)
Sodium: 139 mEq/L (ref 132–144)
Total Bilirubin: 0.2 mg/dL (ref 0.0–1.2)
Total Protein: 7 g/dL (ref 6.4–8.1)

## 2013-03-22 LAB — LIPID PANEL
Cholesterol, Total: 256 mg/dL — ABNORMAL HIGH (ref 0–199)
HDL: 68 mg/dL — ABNORMAL HIGH (ref 40–59)
LDL Calculated: 142 mg/dL — ABNORMAL HIGH (ref 0–129)
Triglycerides: 228 mg/dL — ABNORMAL HIGH (ref 0–200)

## 2013-03-22 LAB — APTT: aPTT: 25.7 s (ref 23.5–33.0)

## 2013-03-22 LAB — PROTIME-INR
INR: 1
Protime: 9.9 s (ref 9.7–11.4)

## 2013-03-27 NOTE — Progress Notes (Signed)
Quick Note:    Labs WNL or stable. YOU HAVE A LOT OF GOOD CHOL   Notify pt.  ______

## 2013-04-13 ENCOUNTER — Encounter

## 2013-04-13 MED ORDER — FENTANYL 12 MCG/HR TD PT72
12 MCG/HR | MEDICATED_PATCH | TRANSDERMAL | Status: DC
Start: 2013-04-13 — End: 2013-09-30

## 2013-04-13 MED ORDER — ESTROGENS CONJUGATED 0.3 MG PO TABS
0.3 MG | ORAL_TABLET | Freq: Every day | ORAL | Status: DC
Start: 2013-04-13 — End: 2013-09-30

## 2013-04-13 NOTE — Telephone Encounter (Signed)
Was on Duragesic 25 MCG, but wants to wean down and go to 12.5 MCG

## 2013-04-13 NOTE — Telephone Encounter (Signed)
Left message to schedule appointment for the pain medication.

## 2013-04-13 NOTE — Telephone Encounter (Signed)
Needs to see me for pain meds.  CEFTIN WAS SENT IN

## 2013-04-18 NOTE — Progress Notes (Signed)
Subjective:      Patient ID: Deborah Crosby is a 58 y.o. female.    HPI  Patient presents today for follow-up on left shoulder pain  Deborah Crosby had surgery on this shoulder about one year ago  The pain is still present but tolerable  She would like to start weaning herself off of this    Also would like a copy of EKG from her Preop exam       Taking current medications which were reviewed.  Problem list discussed.    Overall doing well.  Has no new problem / question.      Health Maintenance Due   Topic Date Due   ??? ZOSTAVAX VACCINE  05/03/2013       Review of Systems   Constitutional: Negative for appetite change and fatigue.   HENT: Negative for congestion, sinus pressure and sore throat.    Eyes: Negative for pain.   Respiratory: Negative for shortness of breath.    Cardiovascular: Negative for chest pain.   Gastrointestinal: Negative for abdominal pain and constipation.   Genitourinary: Negative for difficulty urinating.   Neurological: Negative for dizziness and headaches.     BP 112/76    Pulse 78    Temp(Src) 98.4 ??F (36.9 ??C) (Oral)    Resp 12    Ht 5\' 5"  (1.651 m)    Wt 176 lb (79.833 kg)    BMI 29.29 kg/m2      LMP 05/29/2003      Breastfeeding? No     No Known Allergies  Outpatient Encounter Prescriptions as of 04/18/2013   Medication Sig Dispense Refill   ??? estrogens, conjugated, (PREMARIN) 0.3 MG tablet Take 1 tablet by mouth daily.  30 tablet  3   ??? fentaNYL (DURAGESIC) 12 MCG/HR Place 1 patch onto the skin every 72 hours.  10 patch  0   ??? fentaNYL (DURAGESIC) 25 MCG/HR Place 1 patch onto the skin every 72 hours.  10 patch  0   ??? atenolol-chlorthalidone (TENORETIC) 50-25 MG per tablet Take 1 tablet by mouth daily.  90 tablet  3   ??? SUMAtriptan (IMITREX) 100 MG tablet Take 1 tablet by mouth as needed.  27 tablet  3   ??? cyclobenzaprine (FLEXERIL) 10 MG tablet Take 1 tablet by mouth 3 times daily as needed.  270 tablet  3   ??? diclofenac (VOLTAREN) 75 MG EC tablet Take 1 tablet by mouth 2 times daily.  180  tablet  3   ??? Lansoprazole (PREVACID PO) Take 30 mg by mouth daily.         ??? Multiple Vitamin (MULTIVITAMIN PO) Take  by mouth.           No facility-administered encounter medications on file as of 04/18/2013.     History     Social History   ??? Marital Status: Married     Spouse Name: Bing PlumeJames Stefan     Number of Children: 2   ??? Years of Education: N/A     Occupational History   ??? 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 Smoking status: Never Smoker    ??? Smokeless tobacco: Never Used   ??? Alcohol Use: No   ??? Drug Use: No   ??? Sexual Activity: Not on file     Other Topics Concern   ??? Not on file     Social History Narrative  Family History   Problem Relation Age of Onset   ??? Cancer Mother      breast   ??? Heart Disease Father    ??? Cancer Father      colon     Past Medical History   Diagnosis Date   ??? Hypertension    ??? GERD (gastroesophageal reflux disease)    ??? Migraine headache    ??? Gout    ??? Migraine headache      Past Surgical History   Procedure Laterality Date   ??? Appendectomy     ??? Tonsillectomy     ??? Colonoscopy  09/21/2009   ??? Hysterectomy       partial   ??? Upper gastrointestinal endoscopy  09/21/2009   ??? Colonoscopy  05/30/13     DR LISI       Objective:   Physical Exam   HENT:   Nose: Nose normal.   Mouth/Throat: Oropharynx is clear and moist.   Eyes: Conjunctivae are normal. Pupils are equal, round, and reactive to light.   Neck: Neck supple.   Cardiovascular: Normal rate, regular rhythm and normal heart sounds.    No murmur heard.  Pulmonary/Chest: Breath sounds normal. She has no wheezes.   Abdominal: Soft. She exhibits no mass. There is no tenderness.   Musculoskeletal: Normal range of motion.   MILD left ANTERIOR SHOULDER PAIN WITH range of motion    Lymphadenopathy:     She has no cervical adenopathy.       Assessment:      Encounter Diagnoses   Name Primary?   ??? Shoulder pain, acute, left Yes   ??? Hypertension    ??? OA (osteoarthritis)             Plan:      LONG TALK  OTC  ALEVE  Avoid activity that exacerbates symptoms.   Call or return to office as needed if these symptoms worsen or fail to improve as anticipated.

## 2013-09-30 MED ORDER — SUMATRIPTAN SUCCINATE 100 MG PO TABS
100 | ORAL_TABLET | ORAL | Status: DC | PRN
Start: 2013-09-30 — End: 2015-04-04

## 2013-09-30 MED ORDER — CYCLOBENZAPRINE HCL 10 MG PO TABS
10 MG | ORAL_TABLET | Freq: Three times a day (TID) | ORAL | Status: DC | PRN
Start: 2013-09-30 — End: 2015-04-04

## 2013-09-30 MED ORDER — DICLOFENAC SODIUM 75 MG PO TBEC
75 MG | ORAL_TABLET | Freq: Two times a day (BID) | ORAL | Status: DC
Start: 2013-09-30 — End: 2015-04-04

## 2013-09-30 MED ORDER — ATENOLOL-CHLORTHALIDONE 50-25 MG PO TABS
50-25 MG | ORAL_TABLET | Freq: Every day | ORAL | Status: DC
Start: 2013-09-30 — End: 2013-11-25

## 2013-09-30 MED ORDER — ESTROGENS CONJUGATED 0.3 MG PO TABS
0.3 MG | ORAL_TABLET | Freq: Every day | ORAL | Status: DC
Start: 2013-09-30 — End: 2014-05-29

## 2013-09-30 NOTE — Progress Notes (Signed)
Subjective:      Patient ID: Deborah Crosby is a 58 y.o. female.    Palpitations   This is a new problem. Episode onset: 1 week. The problem occurs intermittently. The problem has been waxing and waning. On average, each episode lasts 30 minutes. Nothing aggravates the symptoms. Associated symptoms include an irregular heartbeat. Pertinent negatives include no anxiety, chest fullness, chest pain, coughing, dizziness, fever, malaise/fatigue, nausea, numbness, shortness of breath, vomiting or weakness. Treatments tried: Atenolol 25 mg. The treatment provided mild relief.       Taking current medications which were reviewed.  Problem list discussed.    Overall doing well.  Has no other new problems / questions.      Assessment:        Plan:      Review of Systems   Constitutional: Negative for fever and malaise/fatigue.   Respiratory: Negative for cough and shortness of breath.    Cardiovascular: Positive for palpitations. Negative for chest pain.   Gastrointestinal: Negative for nausea and vomiting.   Neurological: Negative for dizziness, weakness and numbness.   Psychiatric/Behavioral: The patient is not nervous/anxious.      BP 110/70    Pulse 76    Temp(Src) 98.4 ??F (36.9 ??C) (Temporal)    Resp 20    Ht 5\' 5"  (1.651 m)    Wt 178 lb 6.4 oz (80.922 kg)    BMI 29.69 kg/m2      LMP 05/29/2003      Breastfeeding? No     No Known Allergies  Outpatient Encounter Prescriptions as of 09/30/2013   Medication Sig Dispense Refill   ??? estrogens, conjugated, (PREMARIN) 0.3 MG tablet Take 1 tablet by mouth daily  30 tablet  3   ??? atenolol-chlorthalidone (TENORETIC) 50-25 MG per tablet Take 1 tablet by mouth daily  90 tablet  3   ??? SUMAtriptan (IMITREX) 100 MG tablet Take 1 tablet by mouth as needed  27 tablet  3   ??? cyclobenzaprine (FLEXERIL) 10 MG tablet Take 1 tablet by mouth 3 times daily as needed  270 tablet  3   ??? diclofenac (VOLTAREN) 75 MG EC tablet Take 1 tablet by mouth 2 times daily  180 tablet  3   ??? Lansoprazole  (PREVACID PO) Take 30 mg by mouth daily.         ??? Multiple Vitamin (MULTIVITAMIN PO) Take  by mouth.         ??? [DISCONTINUED] estrogens, conjugated, (PREMARIN) 0.3 MG tablet Take 1 tablet by mouth daily.  30 tablet  3   ??? [DISCONTINUED] fentaNYL (DURAGESIC) 12 MCG/HR Place 1 patch onto the skin every 72 hours.  10 patch  0   ??? [DISCONTINUED] fentaNYL (DURAGESIC) 25 MCG/HR Place 1 patch onto the skin every 72 hours.  10 patch  0   ??? [DISCONTINUED] atenolol-chlorthalidone (TENORETIC) 50-25 MG per tablet Take 1 tablet by mouth daily.  90 tablet  3   ??? [DISCONTINUED] SUMAtriptan (IMITREX) 100 MG tablet Take 1 tablet by mouth as needed.  27 tablet  3   ??? [DISCONTINUED] cyclobenzaprine (FLEXERIL) 10 MG tablet Take 1 tablet by mouth 3 times daily as needed.  270 tablet  3   ??? [DISCONTINUED] diclofenac (VOLTAREN) 75 MG EC tablet Take 1 tablet by mouth 2 times daily.  180 tablet  3     No facility-administered encounter medications on file as of 09/30/2013.     History     Social History   ???  Marital Status: Married     Spouse Name: Bing PlumeJames Casselman     Number of Children: 2   ??? Years of Education: N/A     Occupational History   ??? 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 Smoking status: Never Smoker    ??? Smokeless tobacco: Never Used   ??? Alcohol Use: No   ??? Drug Use: No   ??? Sexual Activity: Not on file     Other Topics Concern   ??? Not on file     Social History Narrative     Family History   Problem Relation Age of Onset   ??? Cancer Mother      breast   ??? Heart Disease Father    ??? Cancer Father      colon     Past Medical History   Diagnosis Date   ??? Hypertension    ??? GERD (gastroesophageal reflux disease)    ??? Migraine headache    ??? Gout    ??? Migraine headache      Past Surgical History   Procedure Laterality Date   ??? Appendectomy     ??? Tonsillectomy     ??? Colonoscopy  09/21/2009   ??? Hysterectomy       partial   ??? Upper gastrointestinal endoscopy  09/21/2009   ??? Colonoscopy  05/30/13     DR LISI        Objective:   Physical Exam   HENT:   Nose: Nose normal.   Mouth/Throat: Oropharynx is clear and moist.   Eyes: Conjunctivae are normal. Pupils are equal, round, and reactive to light.   Neck: Neck supple.   Cardiovascular: Normal rate, regular rhythm and normal heart sounds.  Exam reveals no gallop and no friction rub.    No murmur heard.  Pulmonary/Chest: Breath sounds normal. She has no wheezes.   Abdominal: Soft. She exhibits no distension and no mass. There is no tenderness.   Musculoskeletal: She exhibits no edema.   Lymphadenopathy:     She has no cervical adenopathy.   Neurological: She is alert.   Psychiatric: She has a normal mood and affect.     EKG - NSR    Assessment:      Encounter Diagnoses   Name Primary?   ??? Hot flash, menopausal    ??? Hypertension    ??? Palpitations Yes            Plan:      Orders Placed This Encounter   Procedures   ??? Basic Metabolic Panel   ??? TSH without Reflex   ??? T4, free   ??? External Referral To Cardiology     Referral Priority:  Routine     Referral Type:  Consult for Advice and Opinion     Referral Reason:  Specialty Services Required     Requested Specialty:  Cardiology     Number of Visits Requested:  1   ??? EKG 12 lead     Order Specific Question:  Reason for Exam?     Answer:  Other      Orders Placed This Encounter   Medications   ??? estrogens, conjugated, (PREMARIN) 0.3 MG tablet     Sig: Take 1 tablet by mouth daily     Dispense:  30 tablet     Refill:  3   ??? atenolol-chlorthalidone (TENORETIC) 50-25 MG per tablet  Sig: Take 1 tablet by mouth daily     Dispense:  90 tablet     Refill:  3   ??? SUMAtriptan (IMITREX) 100 MG tablet     Sig: Take 1 tablet by mouth as needed     Dispense:  27 tablet     Refill:  3   ??? cyclobenzaprine (FLEXERIL) 10 MG tablet     Sig: Take 1 tablet by mouth 3 times daily as needed     Dispense:  270 tablet     Refill:  3   ??? diclofenac (VOLTAREN) 75 MG EC tablet     Sig: Take 1 tablet by mouth 2 times daily     Dispense:  180 tablet      Refill:  3     LONG TALK  Take medication as prescribed.  Avoid activity that exacerbates symptoms.   The importance of a good diet and exercise regimen discussed.      REFER  Follow up as instructed.  Call if any problems.

## 2013-10-01 LAB — TSH, HIGH SENSITIVE: TSH: 0.183 u[IU]/mL — ABNORMAL LOW (ref 0.270–4.200)

## 2013-10-01 LAB — BASIC METABOLIC PANEL
Anion Gap: 13 mEq/L (ref 7–13)
BUN: 18 mg/dL (ref 8–23)
CO2: 28 mEq/L (ref 22–29)
Calcium: 9 mg/dL (ref 8.6–10.2)
Chloride: 101 mEq/L (ref 98–107)
Creatinine: 0.91 mg/dL — ABNORMAL HIGH (ref 0.50–0.90)
GFR African American: >60 (ref >60)
GFR Non-African American: >60 (ref >60)
Glucose: 82 mg/dL (ref 74–109)
Potassium: 3.9 mEq/L (ref 3.5–5.1)
Sodium: 142 mEq/L (ref 132–144)

## 2013-10-01 LAB — T4, FREE: T4 Free: 1.29 ng/dL (ref 0.93–1.70)

## 2013-10-01 NOTE — Progress Notes (Signed)
Quick Note:    Labs WNL or stable. THYROID OK -- WILL RECHECK IN 6 MO   Notify pt.  ______

## 2013-10-03 NOTE — Progress Notes (Signed)
Quick Note:    Letter sent  ______

## 2013-11-25 MED ORDER — ATENOLOL-CHLORTHALIDONE 50-25 MG PO TABS
50-25 MG | ORAL_TABLET | Freq: Every day | ORAL | Status: DC
Start: 2013-11-25 — End: 2014-11-30

## 2013-11-25 MED ORDER — AZITHROMYCIN 250 MG PO TABS
250 MG | ORAL_TABLET | ORAL | Status: DC
Start: 2013-11-25 — End: 2014-01-18

## 2013-11-25 NOTE — Progress Notes (Signed)
Subjective:      Patient ID: Deborah Crosby is a 58 y.o. female.  HPI    Otalgia   There is pain in both ears. This is a new problem. The current episode started yesterday. The problem occurs constantly. The problem has been gradually worsening. There has been no fever. The pain is at a severity of 6/10. The pain is moderate. Associated symptoms include headaches and rhinorrhea. Pertinent negatives include no abdominal pain, coughing, diarrhea, ear discharge, hearing loss, neck pain, rash, sore throat or vomiting. She has tried acetaminophen for the symptoms. The treatment provided no relief. There is no history of a chronic ear infection, hearing loss or a tympanostomy tube.       Review of Systems   HENT: Positive for ear pain and rhinorrhea. Negative for ear discharge, hearing loss and sore throat.    Respiratory: Negative for cough.    Gastrointestinal: Negative for vomiting, abdominal pain and diarrhea.   Musculoskeletal: Negative for neck pain.   Skin: Negative for rash.   Neurological: Positive for headaches.     BP 110/70 mmHg   Pulse 68   Temp(Src) 96.9 ??F (36.1 ??C) (Temporal)   Resp 14   Wt 170 lb 6.4 oz (77.293 kg)   LMP 05/29/2003  No Known Allergies  Outpatient Encounter Prescriptions as of 11/25/2013   Medication Sig Dispense Refill   ??? atenolol-chlorthalidone (TENORETIC) 50-25 MG per tablet Take 1 tablet by mouth daily 90 tablet 3   ??? azithromycin (ZITHROMAX Z-PAK) 250 MG tablet 2 STAT THEN 1 DAILY 6 tablet 0   ??? estrogens, conjugated, (PREMARIN) 0.3 MG tablet Take 1 tablet by mouth daily 30 tablet 3   ??? SUMAtriptan (IMITREX) 100 MG tablet Take 1 tablet by mouth as needed 27 tablet 3   ??? cyclobenzaprine (FLEXERIL) 10 MG tablet Take 1 tablet by mouth 3 times daily as needed 270 tablet 3   ??? diclofenac (VOLTAREN) 75 MG EC tablet Take 1 tablet by mouth 2 times daily 180 tablet 3   ??? [DISCONTINUED] atenolol-chlorthalidone (TENORETIC) 50-25 MG per tablet Take 1 tablet by mouth daily 90 tablet 3   ???  Lansoprazole (PREVACID PO) Take 30 mg by mouth daily.       ??? Multiple Vitamin (MULTIVITAMIN PO) Take  by mouth.         No facility-administered encounter medications on file as of 11/25/2013.     History     Social History   ??? Marital Status: Married     Spouse Name: Charron Coultas     Number of Children: 2   ??? Years of Education: N/A     Occupational History   ??? 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 Smoking status: Never Smoker    ??? Smokeless tobacco: Never Used   ??? Alcohol Use: No   ??? Drug Use: No   ??? Sexual Activity: Not on file     Other Topics Concern   ??? Not on file     Social History Narrative     Family History   Problem Relation Age of Onset   ??? Cancer Mother      breast   ??? Heart Disease Father    ??? Cancer Father      colon     Past Medical History   Diagnosis Date   ??? Hypertension    ??? GERD (gastroesophageal reflux disease)    ???  Migraine headache    ??? Gout    ??? Migraine headache      Past Surgical History   Procedure Laterality Date   ??? Appendectomy     ??? Tonsillectomy     ??? Colonoscopy  09/21/2009   ??? Hysterectomy       partial   ??? Upper gastrointestinal endoscopy  09/21/2009   ??? Colonoscopy  05/30/13     DR LISI       Objective:   Physical Exam   Constitutional: She appears well-developed. No distress.   HENT:   Head: Normocephalic.   Right Ear: Hearing and tympanic membrane normal. No swelling.   Left Ear: Hearing and tympanic membrane normal.   Nose: Rhinorrhea present. Right sinus exhibits maxillary sinus tenderness. Left sinus exhibits maxillary sinus tenderness.   Mouth/Throat: Posterior oropharyngeal erythema present.   DULL TMs   Eyes: Conjunctivae and EOM are normal. Pupils are equal, round, and reactive to light.   Neck: Normal range of motion. Neck supple.   Cardiovascular: Normal rate, regular rhythm and normal heart sounds.    No murmur heard.  Pulmonary/Chest: Effort normal and breath sounds normal. She has no wheezes.   Abdominal: Soft. Bowel sounds are normal. She  exhibits no mass. There is no tenderness.   Lymphadenopathy:     She has no cervical adenopathy.   Skin: No rash noted.       Assessment:      Encounter Diagnoses   Name Primary?   ??? Essential hypertension stable Yes   ??? Bilateral acute serous otitis media, recurrence not specified    ??? Acute upper respiratory infections of unspecified site             Plan:         Orders Placed This Encounter   Medications   ??? atenolol-chlorthalidone (TENORETIC) 50-25 MG per tablet     Sig: Take 1 tablet by mouth daily     Dispense:  90 tablet     Refill:  3   ??? azithromycin (ZITHROMAX Z-PAK) 250 MG tablet     Sig: 2 STAT THEN 1 DAILY     Dispense:  6 tablet     Refill:  0     Long talk  Take medication as prescribed.  OTC cough and cold medication as instructed.  Call or return to office as needed if these symptoms worsen or fail to improve as anticipated.  Follow up as instructed.  Call if any problems.

## 2014-01-18 ENCOUNTER — Ambulatory Visit
Admit: 2014-01-18 | Discharge: 2014-01-18 | Payer: PRIVATE HEALTH INSURANCE | Attending: Family Medicine | Primary: Family Medicine

## 2014-01-18 ENCOUNTER — Encounter: Attending: Family Medicine | Primary: Family Medicine

## 2014-01-18 DIAGNOSIS — R079 Chest pain, unspecified: Secondary | ICD-10-CM

## 2014-01-18 MED ORDER — DEXLANSOPRAZOLE 60 MG PO CPDR
60 MG | ORAL_CAPSULE | Freq: Every day | ORAL | Status: DC
Start: 2014-01-18 — End: 2014-06-23

## 2014-01-18 MED ORDER — TOPIRAMATE 25 MG PO TABS
25 MG | ORAL_TABLET | ORAL | Status: DC
Start: 2014-01-18 — End: 2014-06-23

## 2014-01-18 MED ORDER — ASPIRIN EC 81 MG PO TBEC
81 MG | ORAL_TABLET | Freq: Every day | ORAL | Status: AC
Start: 2014-01-18 — End: ?

## 2014-01-18 NOTE — Progress Notes (Signed)
Subjective:      Patient ID: Deborah Crosby is a 58 y.o. female.    HPI  Chest Pain   This is a new problem. Episode onset: 2 DAYS. The problem has been unchanged. The pain is mild. Quality: gripping. The pain radiates to the left arm. Associated symptoms include abdominal pain, dizziness, headaches, irregular heartbeat, nausea and palpitations. Pertinent negatives include no back pain, claudication, cough, diaphoresis, exertional chest pressure, fever, hemoptysis, leg pain, lower extremity edema, malaise/fatigue, orthopnea, PND, shortness of breath, sputum production, syncope, vomiting or weakness. She has tried nothing for the symptoms.   HAD IT 4-5 X TODAY.  LASTS 10-15 SECONDS.  OCC LONGER      Would like to discuss her Migraines.  Takes Imitrex 100 MG, and this does help.   States they are becoming more frequent.       Also would like to discuss her weight.  Has gained 10 pounds since September.  Does diet, and does exercise regularly.     Wt Readings from Last 3 Encounters:   01/18/14 160 lb 3.2 oz (72.666 kg)   11/25/13 170 lb 6.4 oz (77.293 kg)   09/30/13 178 lb 6.4 oz (80.922 kg)           Has no other questions or concerns    Health Maintenance Is Up-To-Date      Review of Systems   Constitutional: Negative for fever, malaise/fatigue and diaphoresis.   Respiratory: Negative for cough, hemoptysis, sputum production and shortness of breath.    Cardiovascular: Positive for chest pain and palpitations. Negative for orthopnea, claudication, syncope and PND.   Gastrointestinal: Positive for nausea and abdominal pain. Negative for vomiting.   Musculoskeletal: Negative for back pain.   Neurological: Positive for dizziness and headaches. Negative for weakness.     BP 96/78 mmHg   Pulse 72   Temp(Src) 98.2 ??F (36.8 ??C) (Temporal)   Resp 16   Ht 5\' 5"  (1.651 m)   Wt 160 lb 3.2 oz (72.666 kg)   BMI 26.66 kg/m2   LMP 05/29/2003   Breastfeeding? No  No Known Allergies  Outpatient Encounter Prescriptions as of 01/18/2014    Medication Sig Dispense Refill   ??? aspirin EC 81 MG EC tablet Take 1 tablet by mouth daily 30 tablet 3   ??? topiramate (TOPAMAX) 25 MG tablet 1-2 PO Q HS 60 tablet 3   ??? dexlansoprazole (DEXILANT) 60 MG CPDR capsule Take 1 capsule by mouth daily 30 capsule 1   ??? atenolol-chlorthalidone (TENORETIC) 50-25 MG per tablet Take 1 tablet by mouth daily 90 tablet 3   ??? estrogens, conjugated, (PREMARIN) 0.3 MG tablet Take 1 tablet by mouth daily 30 tablet 3   ??? SUMAtriptan (IMITREX) 100 MG tablet Take 1 tablet by mouth as needed 27 tablet 3   ??? cyclobenzaprine (FLEXERIL) 10 MG tablet Take 1 tablet by mouth 3 times daily as needed 270 tablet 3   ??? diclofenac (VOLTAREN) 75 MG EC tablet Take 1 tablet by mouth 2 times daily 180 tablet 3   ??? Lansoprazole (PREVACID PO) Take 30 mg by mouth daily.       ??? Multiple Vitamin (MULTIVITAMIN PO) Take  by mouth.       ??? [DISCONTINUED] azithromycin (ZITHROMAX Z-PAK) 250 MG tablet 2 STAT THEN 1 DAILY 6 tablet 0     No facility-administered encounter medications on file as of 01/18/2014.     History     Social History   ???  Marital Status: Married     Spouse Name: Bing PlumeJames Macha     Number of Children: 2   ??? Years of Education: N/A     Occupational History   ??? 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 Smoking status: Never Smoker    ??? Smokeless tobacco: Never Used   ??? Alcohol Use: No   ??? Drug Use: No   ??? Sexual Activity: Not on file     Other Topics Concern   ??? Not on file     Social History Narrative     Family History   Problem Relation Age of Onset   ??? Cancer Mother      breast   ??? Heart Disease Father    ??? Cancer Father      colon     Past Medical History   Diagnosis Date   ??? Hypertension    ??? GERD (gastroesophageal reflux disease)    ??? Migraine headache    ??? Gout    ??? Migraine headache      Past Surgical History   Procedure Laterality Date   ??? Appendectomy     ??? Tonsillectomy     ??? Colonoscopy  09/21/2009   ??? Hysterectomy       partial   ??? Upper gastrointestinal  endoscopy  09/21/2009   ??? Colonoscopy  05/30/13     DR LISI           Objective:   Physical Exam   Constitutional: She appears well-nourished.   HENT:   Nose: Nose normal.   Mouth/Throat: Oropharynx is clear and moist.   Eyes: Conjunctivae are normal. Pupils are equal, round, and reactive to light.   Neck: Neck supple. No thyromegaly present.   Cardiovascular: Normal rate and regular rhythm.  Exam reveals no gallop and no friction rub.    Murmur heard.  1-2 /6 SYS MURM   Pulmonary/Chest: Breath sounds normal. She has no wheezes.   Abdominal: Soft. She exhibits no distension and no mass. There is no tenderness.   Musculoskeletal: She exhibits no edema.   Lymphadenopathy:     She has no cervical adenopathy.   Neurological: She is alert.   Skin: No rash noted.   Psychiatric: She has a normal mood and affect.       Assessment:      Encounter Diagnoses   Name Primary?   ??? Other screening mammogram    ??? Chest pain, unspecified chest pain type Yes   ??? Essential hypertension  STABLE    ??? Atrial septal defect    ??? Nonintractable migraine, unspecified migraine type    ??? Gastroesophageal reflux disease with esophagitis    ??? Heart murmur             Plan:      Orders Placed This Encounter   Procedures   ??? Mammography digital screen bilateral     Order Specific Question:  Reason for exam:     Answer:  ROUTINE EXAM   ??? Echo Complete   ??? EKG 12 lead     Order Specific Question:  Reason for Exam?     Answer:  Chest pain      Orders Placed This Encounter   Medications   ??? aspirin EC 81 MG EC tablet     Sig: Take 1 tablet by mouth daily     Dispense:  30 tablet  Refill:  3   ??? topiramate (TOPAMAX) 25 MG tablet     Sig: 1-2 PO Q HS     Dispense:  60 tablet     Refill:  3   ??? dexlansoprazole (DEXILANT) 60 MG CPDR capsule     Sig: Take 1 capsule by mouth daily     Dispense:  30 capsule     Refill:  1     LONG TALK  Take medication as prescribed.  Avoid activity that exacerbates symptoms.   The importance of a good diet and exercise  regimen discussed.      Follow up as instructed.  Call if any problems.   Call or return to office as needed if these symptoms worsen or fail to improve as anticipated.

## 2014-02-07 ENCOUNTER — Ambulatory Visit
Admit: 2014-02-07 | Discharge: 2014-02-07 | Payer: PRIVATE HEALTH INSURANCE | Attending: Internal Medicine | Primary: Family Medicine

## 2014-02-07 DIAGNOSIS — I10 Essential (primary) hypertension: Secondary | ICD-10-CM

## 2014-02-07 NOTE — Progress Notes (Signed)
Chief Complaint   Patient presents with   ??? Establish Cardiologist     transfer [facility]  done 01/18/2014   ??? Results     echo 01/24/2014     [name]      02-07-14: Patient presents for initial medical evaluation. Patient is followed on a regular basis by Dr. Suann LarryFrank Hiti, MD. [facility] and would like to switch. Does have a hx of ASD that is being monitored clinically. 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 No hx of TIA or CVA. Does have some midsternal chest discomfort on and off. Feels like a squeezing/pressure type of pain, at rest, lasts for a few seconds/minutes, no radiation to jaw.  Pt denies dyspnea, dyspnea on exertion, change in exercise capacity, fatigue,  nausea, vomiting, diarrhea, constipation, motor weakness, insomnia, weight loss, syncope, dizziness, lightheadedness, palpitations, PND, orthopnea. 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 No hx of MI, CHF or arrhythmia. Last stress test was more than 5 yrs ago. No hx of LHC. No smoking hx. BP is normal. Father with hx of CABG at the age of 662.     Patient Active Problem List   Diagnosis   ??? GERD (gastroesophageal reflux disease)   ??? Migraine headache   ??? Essential hypertension   ??? Atrial septal defect   ??? First degree AV block   ??? Precordial pain   ??? Family history of premature CAD       Past Surgical History   Procedure Laterality Date   ??? Appendectomy     ??? Tonsillectomy     ??? Colonoscopy  09/21/2009   ??? Hysterectomy       partial   ??? Upper gastrointestinal endoscopy  09/21/2009   ??? Colonoscopy  05/30/13     DR LISI       History     Social History   ??? Marital Status: Married     Spouse Name: Bing PlumeJames Stifter     Number of Children: 2   ??? Years of Education: N/A     Occupational History   ??? 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 Smoking  status: Never Smoker    ??? Smokeless tobacco: Never Used   ??? Alcohol Use: No   ??? Drug Use: No   ??? Sexual Activity: None     Other Topics Concern   ??? None     Social History Narrative       Family History   Problem Relation Age of Onset   ??? Cancer Mother      breast   ??? Heart Disease Father    ??? Cancer Father      colon       Current Outpatient Prescriptions   Medication Sig Dispense Refill   ??? aspirin EC 81 MG EC tablet Take 1 tablet by mouth daily 30 tablet 3   ??? topiramate (TOPAMAX) 25 MG tablet 1-2 PO Q HS 60 tablet 3   ??? dexlansoprazole (DEXILANT) 60 MG CPDR capsule Take 1 capsule by mouth daily 30 capsule 1   ??? atenolol-chlorthalidone (TENORETIC) 50-25 MG per tablet Take 1 tablet by mouth daily 90 tablet 3   ??? estrogens, conjugated, (PREMARIN) 0.3 MG  tablet Take 1 tablet by mouth daily 30 tablet 3   ??? SUMAtriptan (IMITREX) 100 MG tablet Take 1 tablet by mouth as needed 27 tablet 3   ??? cyclobenzaprine (FLEXERIL) 10 MG tablet Take 1 tablet by mouth 3 times daily as needed 270 tablet 3   ??? diclofenac (VOLTAREN) 75 MG EC tablet Take 1 tablet by mouth 2 times daily 180 tablet 3   ??? Lansoprazole (PREVACID PO) Take 30 mg by mouth daily.       ??? Multiple Vitamin (MULTIVITAMIN PO) Take  by mouth.         No current facility-administered medications for this visit.       Review of patient's allergies indicates no known allergies.    Review of Systems:  General ROS: negative  Psychological ROS: negative  Hematological and Lymphatic ROS: No history of blood clots or bleeding disorder.   Respiratory ROS: no cough, shortness of breath, or wheezing  Cardiovascular ROS: no chest pain or dyspnea on exertion  Gastrointestinal ROS: no abdominal pain, change in bowel habits, or black or bloody stools  Genito-Urinary ROS: no dysuria, trouble voiding, or hematuria  Musculoskeletal ROS: negative  Neurological ROS: no TIA or stroke symptoms  Dermatological ROS: negative    VITALS:  Blood pressure 112/86, pulse 58, resp. rate 16, height  5\' 5"  (1.651 m), weight 155 lb 6.4 oz (70.489 kg), last menstrual period 05/29/2003, SpO2 98 %, not currently breastfeeding.  Body mass index is 25.86 kg/(m^2).    Physical Examination:  General appearance - alert, well appearing, and in no distress  Mental status - alert, oriented to person, place, and time  Neck - Neck is supple, no JVD or carotid bruits.  No thyromegaly or adenopathy.   Chest - clear to auscultation, no wheezes, rales or rhonchi, symmetric air entry  Heart - normal rate, regular rhythm, normal S1, S2, no murmurs, rubs, clicks or gallops  Abdomen - soft, nontender, nondistended, no masses or organomegaly  Neurological - alert, oriented, normal speech, no focal findings or movement disorder noted  Extremities - peripheral pulses normal, no pedal edema, no clubbing or cyanosis  Skin - normal coloration and turgor, no rashes, no suspicious skin lesions noted      Orders Placed This Encounter   Procedures   ??? Stress test, lexiscan       ASSESSMENT:    1. Essential hypertension     2. Atrial septal defect     3. First degree AV block     4. Precordial pain  Stress test, lexiscan   5. Family history of premature CAD     6. Abnormal EKG  Stress test, lexiscan         PLAN:     Patient will need to continue to follow up with you for their general medical care     As always, aggressive risk factor modification is strongly recommended. We should adhere to the JNC VII guidelines for HTN management and the NCEP ATP III guidelines for LDL-C management.    Cardiac diet is always recommended with low fat, cholesterol, calories and sodium.    Continue medications at current doses.     Obtain a nuclear myocardial perfusion stress test to r/o cardiac ischemia    Will check echo in one year.     Patient was advised and encouraged to check blood pressure at home or at a pharmacy, maintain a logbook, and also call us back if blood pressure are above the  target ranges or if it is low. Patient clearly understands and  agrees to the instructions.     We will need to continue to monitor muscle and liver enzymes, BUN, CR, and electrolytes.    Details of medical condition explained and patient was warned about adverse consequences of uncontrolled medical conditions and possible side effects of prescribed medications.     Patient was advised to go to the ER if he starts experiencing adverse effects of the medications.patient was instructed to call us back or go to nearby emergency room immediately if symptoms get worse or do not improve.    Thank you for allowing me to participate in the care of your patient, please don't hesitate to contact me if you have any further questions.

## 2014-02-12 NOTE — Progress Notes (Signed)
Quick Note:    HEART ECHO WNL. -- AGE RELATED CHANGES   Notify pt.  ______

## 2014-02-14 NOTE — Progress Notes (Signed)
LMOM for patient to RTC

## 2014-02-15 NOTE — Progress Notes (Signed)
Pt aware

## 2014-03-07 ENCOUNTER — Encounter: Attending: Internal Medicine | Primary: Family Medicine

## 2014-03-23 ENCOUNTER — Ambulatory Visit
Admit: 2014-03-23 | Discharge: 2014-03-23 | Payer: PRIVATE HEALTH INSURANCE | Attending: Internal Medicine | Primary: Family Medicine

## 2014-03-23 DIAGNOSIS — I10 Essential (primary) hypertension: Secondary | ICD-10-CM

## 2014-03-23 NOTE — Progress Notes (Signed)
Chief Complaint   Patient presents with   ??? Hypertension   ??? 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 Patient is followed on a regular basis by Dr. Suann LarryFrank Hiti, MD. [facility] and would like to switch. Does have a hx of ASD that is being monitored clinically. 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 No hx of TIA or CVA. Does have some midsternal chest discomfort on and off. Feels like a squeezing/pressure type of pain, at rest, lasts for a few seconds/minutes, no radiation to jaw.  Pt denies dyspnea, dyspnea on exertion, change in exercise capacity, fatigue,  nausea, vomiting, diarrhea, constipation, motor weakness, insomnia, weight loss, syncope, dizziness, lightheadedness, palpitations, PND, orthopnea. 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 No hx of MI, CHF or arrhythmia. Last stress test was more than 5 yrs ago. No hx of LHC. No smoking hx. BP is normal. Father with hx of CABG at the age of 61.     03-23-14: s/p normal nuclear stress test with EF of 70%. Does have some palpitations. BP and HR are normal. Pt denies chest pain, dyspnea, dyspnea on exertion, change in exercise capacity, fatigue,  nausea, vomiting, diarrhea, constipation, motor weakness, insomnia, weight loss, syncope, dizziness, lightheadedness, palpitations, PND, orthopnea, or claudication.    Patient Active Problem List   Diagnosis   ??? GERD (gastroesophageal reflux disease)   ??? Migraine headache   ??? Essential hypertension   ??? Atrial septal defect   ??? First degree AV block   ??? Precordial pain   ??? Family history of premature CAD       Past Surgical History   Procedure Laterality Date   ??? Appendectomy     ??? Tonsillectomy     ??? Colonoscopy  09/21/2009   ??? Hysterectomy       partial   ??? Upper  gastrointestinal endoscopy  09/21/2009   ??? Colonoscopy  05/30/13     DR LISI       History     Social History   ??? Marital Status: Married     Spouse Name: Bing PlumeJames Bayly     Number of Children: 2   ??? Years of Education: N/A     Occupational History   ??? 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 Smoking status: Never Smoker    ??? Smokeless tobacco: Never Used   ??? Alcohol Use: No   ??? Drug Use: No   ??? Sexual Activity: None     Other Topics Concern   ??? None     Social History Narrative       Family History   Problem Relation Age of Onset   ??? Cancer Mother      breast   ??? Heart Disease Father    ??? Cancer Father      colon       Current Outpatient Prescriptions   Medication Sig Dispense Refill   ??? aspirin EC 81 MG EC tablet Take 1 tablet by mouth daily 30 tablet 3   ??? topiramate (TOPAMAX) 25 MG tablet 1-2 PO Q HS 60 tablet 3   ???  dexlansoprazole (DEXILANT) 60 MG CPDR capsule Take 1 capsule by mouth daily 30 capsule 1   ??? atenolol-chlorthalidone (TENORETIC) 50-25 MG per tablet Take 1 tablet by mouth daily 90 tablet 3   ??? estrogens, conjugated, (PREMARIN) 0.3 MG tablet Take 1 tablet by mouth daily 30 tablet 3   ??? SUMAtriptan (IMITREX) 100 MG tablet Take 1 tablet by mouth as needed 27 tablet 3   ??? cyclobenzaprine (FLEXERIL) 10 MG tablet Take 1 tablet by mouth 3 times daily as needed 270 tablet 3   ??? diclofenac (VOLTAREN) 75 MG EC tablet Take 1 tablet by mouth 2 times daily 180 tablet 3   ??? Lansoprazole (PREVACID PO) Take 30 mg by mouth daily.       ??? Multiple Vitamin (MULTIVITAMIN PO) Take  by mouth.         No current facility-administered medications for this visit.       Review of patient's allergies indicates no known allergies.    Review of Systems:  General ROS: negative  Psychological ROS: negative  Hematological and Lymphatic ROS: No history of blood clots or bleeding disorder.   Respiratory ROS: no cough, shortness of breath, or wheezing  Cardiovascular ROS: no chest pain or dyspnea on  exertion  Gastrointestinal ROS: no abdominal pain, change in bowel habits, or black or bloody stools  Genito-Urinary ROS: no dysuria, trouble voiding, or hematuria  Musculoskeletal ROS: negative  Neurological ROS: no TIA or stroke symptoms  Dermatological ROS: negative    VITALS:  Blood pressure 122/86, pulse 58, resp. rate 14, height  (1.651 m), weight 155 lb (70.308 kg), last menstrual period 05/29/2003, SpO2 97 %, not currently breastfeeding.  Body mass index is 25.79 kg/(m^2).    Physical Examination:  General appearance - alert, well appearing, and in no distress  Mental status - alert, oriented to person, place, and time  Neck - Neck is supple, no JVD or carotid bruits.  No thyromegaly or adenopathy.   Chest - clear to auscultation, no wheezes, rales or rhonchi, symmetric air entry  Heart - normal rate, regular rhythm, normal S1, S2, no murmurs, rubs, clicks or gallops  Abdomen - soft, nontender, nondistended, no masses or organomegaly  Neurological - alert, oriented, normal speech, no focal findings or movement disorder noted  Extremities - peripheral pulses normal, no pedal edema, no clubbing or cyanosis  Skin - normal coloration and turgor, no rashes, no suspicious skin lesions noted      No orders of the defined types were placed in this encounter.       ASSESSMENT:    1. Essential hypertension    2. Atrial septal defect    3. First degree AV block    4. Precordial pain    5. Family history of premature CAD          PLAN:     Patient will need to continue to follow up with you for their general medical care     As always, aggressive risk factor modification is strongly recommended. We should adhere to the JNC VII guidelines for HTN management and the NCEP ATP III guidelines for LDL-C management.    Cardiac diet is always recommended with low fat, cholesterol, calories and sodium.    Continue medications at current doses.     Will check echo in one year.     Patient was advised and encouraged to check  blood pressure at home or at a pharmacy, maintain a logbook, and also call  us back if blood pressure are above the target ranges or if it is low. Patient clearly understands and agrees to the instructions.     We will need to continue to monitor muscle and liver enzymes, BUN, CR, and electrolytes.    Details of medical condition explained and patient was warned about adverse consequences of uncontrolled medical conditions and possible side effects of prescribed medications.     Patient was advised to go to the ER if he starts experiencing adverse effects of the medications.patient was instructed to call us back or go to nearby emergency room immediately if symptoms get worse or do not improve.    Thank you for allowing me to participate in the care of your patient, please don't hesitate to contact me if you have any further questions.

## 2014-05-29 MED ORDER — PREMARIN 0.3 MG PO TABS
0.3 MG | ORAL_TABLET | ORAL | Status: DC
Start: 2014-05-29 — End: 2015-02-05

## 2014-06-23 ENCOUNTER — Ambulatory Visit
Admit: 2014-06-23 | Discharge: 2014-06-23 | Payer: PRIVATE HEALTH INSURANCE | Attending: Family Medicine | Primary: Family Medicine

## 2014-06-23 DIAGNOSIS — K219 Gastro-esophageal reflux disease without esophagitis: Secondary | ICD-10-CM

## 2014-06-23 MED ORDER — LANSOPRAZOLE 30 MG PO CPDR
30 MG | ORAL_CAPSULE | Freq: Every day | ORAL | Status: DC
Start: 2014-06-23 — End: 2014-10-27

## 2014-06-23 NOTE — Progress Notes (Signed)
Subjective:      Patient ID: Deborah Crosby is a 59 y.o. female.    HPI  Presents today complaining of Abdominal pain for 14 days.  This is left upper quad.  The pain is 2 to 6 at times.   Pain localizes to the left upper quadrant   no trauma / injury.  No swelling.  Yes pain with range of motion.  Medication tried prior to visit : aleve, and motrin with no relief      Review of Systems   Constitutional: Negative for fever and appetite change.   HENT: Negative for congestion, sinus pressure and sore throat.    Eyes: Negative.    Respiratory: Negative for shortness of breath.         OCC LEFT UPPER CHEST PAIN PAST FEW DAYS WITH ABD SX   Cardiovascular: Negative for chest pain.   Gastrointestinal: Positive for abdominal pain. Negative for nausea, diarrhea, constipation, blood in stool and abdominal distention.        NO BLACK STOOLS   Genitourinary: Negative for difficulty urinating.   Neurological: Negative for dizziness and headaches.     BP 112/78 mmHg   Pulse 72   Temp(Src) 97.7 ??F (36.5 ??C) (Temporal)   Resp 14   Ht  (1.651 m)   Wt 151 lb 6.4 oz (68.675 kg)   BMI 25.19 kg/m2   LMP 05/29/2003  No Known Allergies  Outpatient Encounter Prescriptions as of 06/23/2014   Medication Sig Dispense Refill   ??? lansoprazole (PREVACID) 30 MG capsule Take 1 capsule by mouth daily 30 capsule 3   ??? PREMARIN 0.3 MG tablet TAKE ONE TABLET BY MOUTH ONCE DAILY 30 tablet 3   ??? aspirin EC 81 MG EC tablet Take 1 tablet by mouth daily 30 tablet 3   ??? atenolol-chlorthalidone (TENORETIC) 50-25 MG per tablet Take 1 tablet by mouth daily 90 tablet 3   ??? SUMAtriptan (IMITREX) 100 MG tablet Take 1 tablet by mouth as needed 27 tablet 3   ??? cyclobenzaprine (FLEXERIL) 10 MG tablet Take 1 tablet by mouth 3 times daily as needed 270 tablet 3   ??? diclofenac (VOLTAREN) 75 MG EC tablet Take 1 tablet by mouth 2 times daily 180 tablet 3   ??? Lansoprazole (PREVACID PO) Take 30 mg by mouth daily.       ??? Multiple Vitamin (MULTIVITAMIN PO) Take  by  mouth.       ??? [DISCONTINUED] topiramate (TOPAMAX) 25 MG tablet 1-2 PO Q HS 60 tablet 3   ??? [DISCONTINUED] dexlansoprazole (DEXILANT) 60 MG CPDR capsule Take 1 capsule by mouth daily 30 capsule 1     No facility-administered encounter medications on file as of 06/23/2014.     History     Social History   ??? Marital Status: Married     Spouse Name: Allix Blomquist     Number of Children: 2   ??? Years of Education: N/A     Occupational History   ??? 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 Smoking status: Never Smoker    ??? Smokeless tobacco: Never Used   ??? Alcohol Use: No   ??? Drug Use: No   ??? Sexual Activity: Not on file     Other Topics Concern   ??? Not on file     Social History Narrative     Family History   Problem Relation  Age of Onset   ??? Cancer Mother      breast   ??? Heart Disease Father    ??? Cancer Father      colon     Past Medical History   Diagnosis Date   ??? Hypertension    ??? GERD (gastroesophageal reflux disease)    ??? Migraine headache    ??? Gout    ??? Migraine headache    ??? First degree AV block 02/07/2014   ??? Precordial pain 02/07/2014   ??? Family history of premature CAD 02/07/2014     Past Surgical History   Procedure Laterality Date   ??? Appendectomy     ??? Tonsillectomy     ??? Colonoscopy  09/21/2009   ??? Hysterectomy       partial   ??? Upper gastrointestinal endoscopy  09/21/2009   ??? Colonoscopy  05/30/13     DR LISI           Objective:   Physical Exam   HENT:   Nose: Nose normal.   Mouth/Throat: Oropharynx is clear and moist.   Eyes: Conjunctivae are normal. Pupils are equal, round, and reactive to light.   Neck: Neck supple.   Cardiovascular: Normal rate, regular rhythm and normal heart sounds.    No murmur heard.  Pulmonary/Chest: Breath sounds normal. She has no wheezes.   Abdominal: Soft. She exhibits no distension and no mass. There is tenderness. There is no rebound and no guarding.   MILD EPIG AND LUQ TENDERNESS   Musculoskeletal: She exhibits no edema.   Lymphadenopathy:     She has no  cervical adenopathy.   Neurological: She is alert.   Skin: No rash noted.     EKG - NSR WITH NO ACUTE CHANGES    Assessment:      Encounter Diagnoses   Name Primary?   ??? Gastroesophageal reflux disease without esophagitis Yes   ??? Chest pain, unspecified chest pain type    ??? Essential hypertension             Plan:      Orders Placed This Encounter   Procedures   ??? EKG 12 Lead     Order Specific Question:  Reason for Exam?     Answer:  Chest pain      Orders Placed This Encounter   Medications   ??? lansoprazole (PREVACID) 30 MG capsule     Sig: Take 1 capsule by mouth daily     Dispense:  30 capsule     Refill:  3     LONG TALK  WILL INCREASE PREVACID TO BID  TUMS AFTER LUNCH AND SUPPER  Avoid activity that exacerbates symptoms.   Call or return to office as needed if these symptoms worsen or fail to improve as anticipated.  Follow up as instructed.  Call if any problems.

## 2014-06-27 NOTE — Telephone Encounter (Signed)
Left message for patient to call and schedule a follow up appointment.

## 2014-06-27 NOTE — Telephone Encounter (Signed)
Should be seen for recheck and possible labs.  Let her know and arrange

## 2014-06-27 NOTE — Telephone Encounter (Signed)
Patient calling, she was in on 06-23-14 and she is calling you give you an update. She states that the pain has subsided a little but now she has notice when she has a bowel movement there is blood in her stool and it is bright red. Should she be concerned or what do you recommend. Please advise.

## 2014-06-30 ENCOUNTER — Ambulatory Visit
Admit: 2014-06-30 | Discharge: 2014-06-30 | Payer: PRIVATE HEALTH INSURANCE | Attending: Family Medicine | Primary: Family Medicine

## 2014-06-30 DIAGNOSIS — R1013 Epigastric pain: Secondary | ICD-10-CM

## 2014-06-30 MED ORDER — AMOXICILLIN-POT CLAVULANATE 875-125 MG PO TABS
875-125 MG | ORAL_TABLET | Freq: Two times a day (BID) | ORAL | Status: AC
Start: 2014-06-30 — End: 2014-07-10

## 2014-06-30 NOTE — Progress Notes (Signed)
Subjective:      Patient ID: Deborah Crosby is a 59 y.o. female.    HPI  Patient presents today for a follow-up on Abdominal Pain.  Located in the [facility].  Is taking Prevacid 30 MG BID.  States the pain is a lot better.  On 06-27-14 was having blood with every BM.   This only happened the one day, but has not had a bowel movement since then.   Taking current medications which were reviewed.  Problem list discussed.  GOING TO WORK REG    Overall doing well. EATING OK  Has no new problem / question.    Health Maintenance Is Up-To-Date    Review of Systems   Constitutional: Negative for fever, appetite change and fatigue.   HENT: Negative for congestion, ear pain, sinus pressure and sore throat.    Eyes: Negative.    Respiratory: Negative for cough and shortness of breath.    Cardiovascular: Negative for chest pain and palpitations.   Genitourinary: Negative for difficulty urinating.   Psychiatric/Behavioral: Negative.      BP 130/86 mmHg   Pulse 60   Temp(Src) 97.9 ??F (36.6 ??C) (Temporal)   Resp 12   Ht  (1.651 m)   Wt 154 lb 3.2 oz (69.945 kg)   BMI 25.66 kg/m2   LMP 05/29/2003   Breastfeeding? No  No Known Allergies  Outpatient Encounter Prescriptions as of 06/30/2014   Medication Sig Dispense Refill   ??? amoxicillin-clavulanate (AUGMENTIN) 875-125 MG per tablet Take 1 tablet by mouth 2 times daily for 10 days 20 tablet 0   ??? lansoprazole (PREVACID) 30 MG capsule Take 1 capsule by mouth daily 30 capsule 3   ??? PREMARIN 0.3 MG tablet TAKE ONE TABLET BY MOUTH ONCE DAILY 30 tablet 3   ??? aspirin EC 81 MG EC tablet Take 1 tablet by mouth daily 30 tablet 3   ??? atenolol-chlorthalidone (TENORETIC) 50-25 MG per tablet Take 1 tablet by mouth daily 90 tablet 3   ??? SUMAtriptan (IMITREX) 100 MG tablet Take 1 tablet by mouth as needed 27 tablet 3   ??? cyclobenzaprine (FLEXERIL) 10 MG tablet Take 1 tablet by mouth 3 times daily as needed 270 tablet 3   ??? diclofenac (VOLTAREN) 75 MG EC tablet Take 1 tablet by mouth 2 times daily 180  tablet 3   ??? Multiple Vitamin (MULTIVITAMIN PO) Take  by mouth.       ??? [DISCONTINUED] Lansoprazole (PREVACID PO) Take 30 mg by mouth daily.         No facility-administered encounter medications on file as of 06/30/2014.     History     Social History   ??? Marital Status: Married     Spouse Name: Carolin Quang     Number of Children: 2   ??? Years of Education: N/A     Occupational History   ??? 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 Smoking status: Never Smoker    ??? Smokeless tobacco: Never Used   ??? Alcohol Use: No   ??? Drug Use: No   ??? Sexual Activity: Not on file     Other Topics Concern   ??? Not on file     Social History Narrative     Family History   Problem Relation Age of Onset   ??? Cancer Mother      breast   ??? Heart Disease  Father    ??? Cancer Father      colon     Past Medical History   Diagnosis Date   ??? Hypertension    ??? GERD (gastroesophageal reflux disease)    ??? Migraine headache    ??? Gout    ??? Migraine headache    ??? First degree AV block 02/07/2014   ??? Precordial pain 02/07/2014   ??? Family history of premature CAD 02/07/2014     Past Surgical History   Procedure Laterality Date   ??? Appendectomy     ??? Tonsillectomy     ??? Colonoscopy  09/21/2009   ??? Hysterectomy       partial   ??? Upper gastrointestinal endoscopy  09/21/2009   ??? Colonoscopy  05/30/13     DR LISI           Objective:   Physical Exam   Constitutional: She appears well-nourished.   HENT:   Nose: Nose normal.   Mouth/Throat: Oropharynx is clear and moist.   Eyes: Conjunctivae are normal. Pupils are equal, round, and reactive to light.   Neck: Neck supple.   Cardiovascular: Normal rate, regular rhythm and normal heart sounds.    No murmur heard.  Pulmonary/Chest: Breath sounds normal. She has no wheezes.   Abdominal: Soft. She exhibits no distension and no mass. There is tenderness. There is no rebound and no guarding.   MILD EPIG AND LUQ TENDERNESS   Musculoskeletal: She exhibits no edema.   Lymphadenopathy:     She has no  cervical adenopathy.   Neurological: She is alert.   Skin: No rash noted.   Psychiatric: She has a normal mood and affect.       Assessment:      Encounter Diagnoses   Name Primary?   ??? Abdominal pain, epigastric Yes   ??? Gastroesophageal reflux disease with esophagitis    ??? Essential hypertension             Plan:      Orders Placed This Encounter   Procedures   ??? Fluoroscopy upper GI air contrast with KUB     Order Specific Question:  Reason for exam:     Answer:  gerd and gastritis   ??? Comprehensive Metabolic Panel   ??? CBC Auto Differential   ??? Amylase      Orders Placed This Encounter   Medications   ??? amoxicillin-clavulanate (AUGMENTIN) 875-125 MG per tablet     Sig: Take 1 tablet by mouth 2 times daily for 10 days     Dispense:  20 tablet     Refill:  0     LONG TALK  Take medication as prescribed.  Avoid activity that exacerbates symptoms.   Follow up as instructed.  Call if any problems.   Further work up and treatment depending on response and test results.

## 2014-07-02 LAB — COMPREHENSIVE METABOLIC PANEL
ALT: 19 U/L (ref 0–33)
AST: 24 U/L (ref 0–35)
Albumin: 4.5 g/dL (ref 3.9–4.9)
Alkaline Phosphatase: 51 U/L (ref 40–130)
Anion Gap: 12 mEq/L (ref 7–13)
BUN: 24 mg/dL — ABNORMAL HIGH (ref 8–23)
CO2: 28 mEq/L (ref 22–29)
Calcium: 9.2 mg/dL (ref 8.6–10.2)
Chloride: 98 mEq/L (ref 98–107)
Creatinine: 0.91 mg/dL — ABNORMAL HIGH (ref 0.50–0.90)
GFR African American: >60 (ref >60)
GFR Non-African American: >60 (ref >60)
Globulin: 2.7 g/dL (ref 2.3–3.5)
Glucose: 88 mg/dL (ref 74–109)
Potassium: 4.2 mEq/L (ref 3.5–5.1)
Sodium: 138 mEq/L (ref 132–144)
Total Bilirubin: 0.3 mg/dL (ref 0.0–1.2)
Total Protein: 7.2 g/dL (ref 6.4–8.1)

## 2014-07-02 LAB — CBC WITH DIFFERENTIAL
Basophils %: 1 %
Basophils Absolute: 0.1 10*3/uL (ref 0.0–0.2)
Eosinophils %: 1.5 %
Eosinophils Absolute: 0.1 10*3/uL (ref 0.0–0.7)
Hematocrit: 40.1 % (ref 37.0–47.0)
Hemoglobin: 13.5 g/dL (ref 12.0–16.0)
Lymphocytes %: 41 %
Lymphocytes Absolute: 2.9 10*3/uL (ref 1.0–4.8)
MCH: 29.5 pg (ref 27.0–31.3)
MCHC: 33.5 % (ref 33.0–37.0)
MCV: 88.1 fL (ref 82.0–100.0)
Monocytes %: 7.5 %
Monocytes Absolute: 0.5 10*3/uL (ref 0.2–0.8)
Neutrophils %: 49 %
Neutrophils Absolute: 3.4 10*3/uL (ref 1.4–6.5)
Platelets: 265 10*3/uL (ref 130–400)
RBC: 4.56 M/uL (ref 4.20–5.40)
RDW: 13.2 % (ref 11.5–14.5)
WBC: 7 10*3/uL (ref 4.8–10.8)

## 2014-07-02 LAB — AMYLASE: Amylase: 81 U/L (ref 28–100)

## 2014-07-04 NOTE — Progress Notes (Signed)
Quick Note:    Labs WNL or stable.    Notify pt.  ______

## 2014-08-11 NOTE — Progress Notes (Signed)
Quick Note:    [facility] - WNL. follow up OR CALL IF SX PERSIST   Notify pt.  ______

## 2014-08-24 ENCOUNTER — Ambulatory Visit
Admit: 2014-08-24 | Discharge: 2014-08-24 | Payer: PRIVATE HEALTH INSURANCE | Attending: Family Medicine | Primary: Family Medicine

## 2014-08-24 DIAGNOSIS — M25512 Pain in left shoulder: Secondary | ICD-10-CM

## 2014-08-24 NOTE — Progress Notes (Signed)
Subjective:      Patient ID: Deborah Crosby is a 59 y.o. female.    HPI   Presents today complaining of left shoulder pain for x 3 weeks.  This is getting worse.  The pain is moderate to severe at times.   Pain localizes to the starts at the back of the shoulder and radiates down the arm.   denies trauma / injury.  denies swelling.  postive pain with range of motion.  Medication tried prior to visit : has tried aleve with mild relief.   AVON ER TUE HAD X RAY.  TX WITH STEROID SHOT AND DID NOT FILL MEDROL    Has no new question or concerns.    Health Maintenance Is Up-To-Date      Review of Systems   Constitutional: Negative for fever, appetite change and fatigue.   HENT: Negative for ear pain, sinus pressure and sore throat.    Eyes: Negative.    Respiratory: Negative for cough and shortness of breath.    Cardiovascular: Negative for chest pain and palpitations.   Gastrointestinal: Negative for abdominal pain, diarrhea and constipation.   Genitourinary: Negative for difficulty urinating.   Psychiatric/Behavioral: Negative.      BP 116/78 mmHg   Pulse 64   Temp(Src) 97.6 ??F (36.4 ??C) (Temporal)   Resp 12   Ht  (1.651 m)   Wt 161 lb (73.029 kg)   BMI 26.79 kg/m2   LMP 05/29/2003  No Known Allergies  Outpatient Encounter Prescriptions as of 08/24/2014   Medication Sig Dispense Refill   ??? lansoprazole (PREVACID) 30 MG capsule Take 1 capsule by mouth daily 30 capsule 3   ??? PREMARIN 0.3 MG tablet TAKE ONE TABLET BY MOUTH ONCE DAILY 30 tablet 3   ??? aspirin EC 81 MG EC tablet Take 1 tablet by mouth daily 30 tablet 3   ??? atenolol-chlorthalidone (TENORETIC) 50-25 MG per tablet Take 1 tablet by mouth daily 90 tablet 3   ??? SUMAtriptan (IMITREX) 100 MG tablet Take 1 tablet by mouth as needed 27 tablet 3   ??? cyclobenzaprine (FLEXERIL) 10 MG tablet Take 1 tablet by mouth 3 times daily as needed 270 tablet 3   ??? diclofenac (VOLTAREN) 75 MG EC tablet Take 1 tablet by mouth 2 times daily 180 tablet 3   ??? Multiple Vitamin  (MULTIVITAMIN PO) Take  by mouth.         No facility-administered encounter medications on file as of 08/24/2014.     History     Social History   ??? Marital Status: Married     Spouse Name: Doneshia Hill     Number of Children: 2   ??? Years of Education: N/A     Occupational History   ??? 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 Smoking status: Never Smoker    ??? Smokeless tobacco: Never Used   ??? Alcohol Use: No   ??? Drug Use: No   ??? Sexual Activity: Not on file     Other Topics Concern   ??? Not on file     Social History Narrative     Family History   Problem Relation Age of Onset   ??? Cancer Mother      breast   ??? Heart Disease Father    ??? Cancer Father      colon     Past Medical History  Diagnosis Date   ??? Hypertension    ??? GERD (gastroesophageal reflux disease)    ??? Migraine headache    ??? Gout    ??? Migraine headache    ??? First degree AV block 02/07/2014   ??? Precordial pain 02/07/2014   ??? Family history of premature CAD 02/07/2014     Past Surgical History   Procedure Laterality Date   ??? Appendectomy     ??? Tonsillectomy     ??? Colonoscopy  09/21/2009   ??? Hysterectomy       partial   ??? Upper gastrointestinal endoscopy  09/21/2009   ??? Colonoscopy  05/30/13     DR LISI           Objective:   Physical Exam   HENT:   Nose: Nose normal.   Mouth/Throat: Oropharynx is clear and moist.   Eyes: Conjunctivae are normal. Pupils are equal, round, and reactive to light.   Neck: Neck supple. No thyromegaly present.   Cardiovascular: Normal rate, regular rhythm and normal heart sounds.    No murmur heard.  Pulmonary/Chest: Breath sounds normal. She has no wheezes.   Abdominal: Soft. She exhibits no mass. There is no tenderness.   Musculoskeletal: She exhibits no edema.   MOD ANTERIOR THAT SHOULDER PAIN WITH ROM    Lymphadenopathy:     She has no cervical adenopathy.   Psychiatric: She has a normal mood and affect.       Assessment:      Encounter Diagnoses   Name Primary?   ??? Essential hypertension  STABLE    ???  Chronic left shoulder pain Yes   ??? Osteoarthritis of multiple joints, unspecified osteoarthritis type             Plan:      Orders Placed This Encounter   Procedures   ??? Amb External Referral To Orthopedic Surgery     Referral Priority:  Routine     Referral Type:  Consult for Advice and Opinion     Referral Reason:  Specialty Services Required     Referred to Provider:  Brayton Caves, MD     Requested Specialty:  Orthopedic Surgery     Number of Visits Requested:  1      LONG TALK  Take medication as prescribed.  WILL GET MEDROL  REFER  Avoid activity that exacerbates symptoms.   Follow up as instructed.  Call if any problems.

## 2014-09-28 ENCOUNTER — Ambulatory Visit
Admit: 2014-09-28 | Discharge: 2014-09-28 | Payer: PRIVATE HEALTH INSURANCE | Attending: Internal Medicine | Primary: Family Medicine

## 2014-09-28 DIAGNOSIS — I10 Essential (primary) hypertension: Secondary | ICD-10-CM

## 2014-09-28 NOTE — Progress Notes (Signed)
Chief Complaint   Patient presents with   ??? Hypertension   ??? 6 Month Follow-Up   Chaperone for Intimate Exam    1. Was chaperone offered as part of the rooming process? offered, declined     [name]      02-07-14: Patient presents for initial medical evaluation. Patient is followed on a regular basis by Dr. Suann LarryFrank Hiti, MD. [facility] and would like to switch. Does have a hx of ASD that is being monitored clinically. 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 No hx of TIA or CVA. Does have some midsternal chest discomfort on and off. Feels like a squeezing/pressure type of pain, at rest, lasts for a few seconds/minutes, no radiation to jaw.  Pt denies dyspnea, dyspnea on exertion, change in exercise capacity, fatigue,  nausea, vomiting, diarrhea, constipation, motor weakness, insomnia, weight loss, syncope, dizziness, lightheadedness, palpitations, PND, orthopnea. 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 No hx of MI, CHF or arrhythmia. Last stress test was more than 5 yrs ago. No hx of LHC. No smoking hx. BP is normal. Father with hx of CABG at the age of 61.     03-23-14: s/p normal nuclear stress test with EF of 70%. Does have some palpitations. BP and HR are normal. Pt denies chest pain, dyspnea, dyspnea on exertion, change in exercise capacity, fatigue,  nausea, vomiting, diarrhea, constipation, motor weakness, insomnia, weight loss, syncope, dizziness, lightheadedness, palpitations, PND, orthopnea, or claudication.      09-28-14: did have a couple of episodes of LH/dizz, foggy, while at the store but no syncope. Pt denies chest pain, dyspnea, dyspnea on exertion, change in exercise capacity, fatigue,  nausea, vomiting, diarrhea, constipation, motor weakness, insomnia, weight loss, syncope,  PND, orthopnea, or claudication. No  recent illness or signs of dehydration. BP is good today.   Recent lab work in 06/2014 shows mild renal insuff/pre renal azotemia. Compliant with meds. Some LE edema. Denies any TIA/CVA type symptoms.       Patient Active Problem List   Diagnosis   ??? GERD (gastroesophageal reflux disease)   ??? Migraine headache   ??? Essential hypertension   ??? Atrial septal defect   ??? First degree AV block   ??? Precordial pain   ??? Family history of premature CAD       Past Surgical History   Procedure Laterality Date   ??? Appendectomy     ??? Tonsillectomy     ??? Colonoscopy  09/21/2009   ??? Hysterectomy       partial   ??? Upper gastrointestinal endoscopy  09/21/2009   ??? Colonoscopy  05/30/13     DR LISI       Social History     Social History   ??? Marital status: Married     Spouse name: Bing PlumeJames Buice   ??? Number of children: 2   ??? Years of education: N/A     Occupational History   ??? 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 Smoking status: Never Smoker   ??? Smokeless tobacco: Never Used   ??? Alcohol use No   ??? Drug use: No   ??? Sexual activity: Not on file     Other  Topics Concern   ??? Not on file     Social History Narrative       Family History   Problem Relation Age of Onset   ??? Cancer Mother      breast   ??? Heart Disease Father    ??? Cancer Father      colon       Current Outpatient Prescriptions   Medication Sig Dispense Refill   ??? lansoprazole (PREVACID) 30 MG capsule Take 1 capsule by mouth daily 30 capsule 3   ??? PREMARIN 0.3 MG tablet TAKE ONE TABLET BY MOUTH ONCE DAILY 30 tablet 3   ??? aspirin EC 81 MG EC tablet Take 1 tablet by mouth daily 30 tablet 3   ??? atenolol-chlorthalidone (TENORETIC) 50-25 MG per tablet Take 1 tablet by mouth daily 90 tablet 3   ??? SUMAtriptan (IMITREX) 100 MG tablet Take 1 tablet by mouth as needed 27 tablet 3   ??? cyclobenzaprine (FLEXERIL) 10 MG tablet Take 1 tablet by mouth 3 times daily as needed 270 tablet 3   ??? diclofenac (VOLTAREN) 75 MG EC tablet Take 1 tablet by mouth 2 times daily 180  tablet 3   ??? Multiple Vitamin (MULTIVITAMIN PO) Take  by mouth.         No current facility-administered medications for this visit.        Review of patient's allergies indicates no known allergies.    Review of Systems:  General ROS: negative  Psychological ROS: negative  Hematological and Lymphatic ROS: No history of blood clots or bleeding disorder.   Respiratory ROS: no cough, shortness of breath, or wheezing  Cardiovascular ROS: no chest pain or dyspnea on exertion  Gastrointestinal ROS: no abdominal pain, change in bowel habits, or black or bloody stools  Genito-Urinary ROS: no dysuria, trouble voiding, or hematuria  Musculoskeletal ROS: negative  Neurological ROS: no TIA or stroke symptoms  Dermatological ROS: negative    VITALS:  Blood pressure 120/60, pulse 93, height 5\' 5"  (1.651 m), weight 160 lb (72.6 kg), last menstrual period 05/29/2003, SpO2 98 %, not currently breastfeeding.  Body mass index is 26.63 kg/(m^2).    Physical Examination:  General appearance - alert, well appearing, and in no distress  Mental status - alert, oriented to person, place, and time  Neck - Neck is supple, no JVD or carotid bruits.  No thyromegaly or adenopathy.   Chest - clear to auscultation, no wheezes, rales or rhonchi, symmetric air entry  Heart - normal rate, regular rhythm, normal S1, S2, no murmurs, rubs, clicks or gallops  Abdomen - soft, nontender, nondistended, no masses or organomegaly  Neurological - alert, oriented, normal speech, no focal findings or movement disorder noted  Extremities - peripheral pulses normal, no pedal edema, no clubbing or cyanosis  Skin - normal coloration and turgor, no rashes, no suspicious skin lesions noted      No orders of the defined types were placed in this encounter.      ASSESSMENT:    1. Essential hypertension     2. Atrial septal defect     3. First degree AV block     4. Precordial pain     5. Family history of premature CAD           PLAN:     Patient will need to continue  to follow up with you for their general medical care     As always, aggressive risk factor modification is strongly  recommended. We should adhere to the JNC VII guidelines for HTN management and the NCEP ATP III guidelines for LDL-C management.    Cardiac diet is always recommended with low fat, cholesterol, calories and sodium.    Continue medications at current doses.     Will check ECHO in near future for PFO/IAS    Patient was advised and encouraged to check blood pressure at home or at a pharmacy, maintain a logbook, and also call us back if blood pressure are above the target ranges or if it is low. Patient clearly understands and agrees to the instructions.     We will need to continue to monitor muscle and liver enzymes, BUN, CR, and electrolytes.    Details of medical condition explained and patient was warned about adverse consequences of uncontrolled medical conditions and possible side effects of prescribed medications.     Patient was advised to go to the ER if he starts experiencing adverse effects of the medications.patient was instructed to call us back or go to nearby emergency room immediately if symptoms get worse or do not improve.    Thank you for allowing me to participate in the care of your patient, please don't hesitate to contact me if you have any further questions.

## 2014-10-17 ENCOUNTER — Institutional Professional Consult (permissible substitution)
Admit: 2014-10-17 | Discharge: 2014-10-17 | Payer: PRIVATE HEALTH INSURANCE | Attending: Pain Medicine | Primary: Family Medicine

## 2014-10-17 DIAGNOSIS — M47812 Spondylosis without myelopathy or radiculopathy, cervical region: Secondary | ICD-10-CM

## 2014-10-17 NOTE — Progress Notes (Signed)
Patient:  Deborah Crosby  Date of Birth: [birth year]  Date: 10/17/14      Subjective:     Deborah HeysBarbara M Hataway is a 59 y.o. female who presents today with:    Chief Complaint   Patient presents with   ??? Neck Pain       Allergies:  Review of patient's allergies indicates no known allergies.  Past Medical History   Diagnosis Date   ??? Family history of premature CAD 02/07/2014   ??? First degree AV block 02/07/2014   ??? GERD (gastroesophageal reflux disease)    ??? Gout    ??? Hypertension    ??? Migraine headache    ??? Migraine headache    ??? Precordial pain 02/07/2014     Past Surgical History   Procedure Laterality Date   ??? Appendectomy     ??? Tonsillectomy     ??? Colonoscopy  09/21/2009   ??? Hysterectomy       partial   ??? Upper gastrointestinal endoscopy  09/21/2009   ??? Colonoscopy  05/30/13     DR LISI     Social History     Social History   ??? Marital status: Married     Spouse name: Bing PlumeJames Akamine   ??? Number of children: 2   ??? Years of education: N/A     Occupational History   ??? 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 Smoking status: Never Smoker   ??? Smokeless tobacco: Never Used   ??? Alcohol use No   ??? Drug use: No   ??? Sexual activity: Not on file     Other Topics Concern   ??? Not on file     Social History Narrative     Family History   Problem Relation Age of Onset   ??? Cancer Mother      breast   ??? Heart Disease Father    ??? Cancer Father      colon       Current Outpatient Prescriptions on File Prior to Visit   Medication Sig Dispense Refill   ??? lansoprazole (PREVACID) 30 MG capsule Take 1 capsule by mouth daily 30 capsule 3   ??? PREMARIN 0.3 MG tablet TAKE ONE TABLET BY MOUTH ONCE DAILY 30 tablet 3   ??? aspirin EC 81 MG EC tablet Take 1 tablet by mouth daily 30 tablet 3   ??? atenolol-chlorthalidone (TENORETIC) 50-25 MG per tablet Take 1 tablet by mouth daily 90 tablet 3   ??? SUMAtriptan (IMITREX) 100 MG tablet Take 1 tablet by mouth as needed 27 tablet 3   ??? cyclobenzaprine (FLEXERIL) 10 MG tablet Take 1 tablet by  mouth 3 times daily as needed 270 tablet 3   ??? diclofenac (VOLTAREN) 75 MG EC tablet Take 1 tablet by mouth 2 times daily 180 tablet 3   ??? Multiple Vitamin (MULTIVITAMIN PO) Take  by mouth.         No current facility-administered medications on file prior to visit.        She has not tried physical therapy.      Neck Pain    This is a new (Painstarted in between shoulder bllades X 5 days ag. No fall or injury. Shaw Dr. Kyra MangesZanotti forr ppossible Left shoulder problem.) problem. The current episode started more than 1 year ago. The problem occurs constantly. The problem has been gradually worsening. The pain is  associated with nothing (Had surgery on left shoulder 1.5 yer ago.). The pain is present in the midline and left side. The quality of the pain is described as stabbing. The pain is at a severity of 5/10 (after meds). The pain is moderate. The symptoms are aggravated by position and twisting. The pain is same all the time. Stiffness is present in the morning. Associated symptoms include headaches. Pertinent negatives include no fever. She has tried NSAIDs and ice for the symptoms. The treatment provided moderate relief.       Recent Procedures:  N/A    Review of Systems   Constitutional: Negative for fever.   HENT: Negative for hearing loss.    Respiratory: Negative for shortness of breath.    Gastrointestinal: Negative for constipation, diarrhea and nausea.   Genitourinary: Negative for difficulty urinating.   Musculoskeletal: Positive for back pain and neck pain. Negative for arthralgias.   Skin: Negative for rash.   Neurological: Positive for headaches.   Hematological: Bruises/bleeds easily.   Psychiatric/Behavioral: Negative for sleep disturbance.       Objective:     Vitals:  Visit Vitals   ??? Pulse 73   ??? Ht 5\' 2"  (1.575 m)   ??? Wt 164 lb (74.4 kg)   ??? LMP 05/29/2003   ??? BMI 30 kg/m2   Pain Score:   4  BMI is within normal limits    Physical Exam   Constitutional: She is oriented to person, place, and time.  She appears well-developed and well-nourished.       HENT:   Head: Normocephalic and atraumatic.   Eyes: Conjunctivae are normal. Pupils are equal, round, and reactive to light.   Neck: Normal range of motion. Neck supple.   Cardiovascular: Regular rhythm and normal pulses.    No murmur heard.  Examination of the peripheral vascular system is free of any swelling or pitting edema.   Pulmonary/Chest: Effort normal and breath sounds normal. She has no wheezes.   Abdominal: Soft. Bowel sounds are normal. She exhibits no distension.   Musculoskeletal:   Inspection of all four extremities is normal.    Range of motion in all four extremities is normal.    No instability noted in any of the four extremities.    Muscle strength and tone is normal for all four extremities.   Lymphadenopathy:     She has no cervical adenopathy.        Right: No supraclavicular adenopathy present.   Neurological: She is alert and oriented to person, place, and time. She is not disoriented. No cranial nerve deficit or sensory deficit. Gait normal.   Reflex Scores:       Tricep reflexes are 1+ on the right side and 0 on the left side.       Bicep reflexes are 1+ on the right side and 0 on the left side.       Brachioradialis reflexes are 1+ on the right side and 0 on the left side.  Neck is tender on left side at C45,  C56, C67  ROM decreased in extension, Bil. Flexion & bil. Rottion.  Hoffman sign neg.  Spurling neg.   Skin: Skin is warm and dry.   Visualized skin is intact in all four extremities.   Psychiatric: She has a normal mood and affect. Judgment normal.   Vitals reviewed.      Radiculopathy:  Negative    Studies Review:  Reviewed MRI report of Left shoulder dated 09/22/14.  Assessment:     1. Cervical spondylosis without myelopathy  PR INJ DX/THER AGNT PARAVERT FACET JOINT, CERV/THORAC, 1ST LEVEL    PR INJ DX/THER AGNT PARAVERT FACET JOINT, CERV/THORAC, 2ND LEVEL    PR INJ DX/THER AGNT PARAVERT FACET JOINT, CERV/THORAC, ADD  LEVEL   2. Chronic pain syndrome           Plan:   Controlled Substances Monitoring: Attestation: The Prescription Monitoring Report for this patient was reviewed today. Irena Cords, MD)    Orders Placed This Encounter   Procedures   ??? PR INJ DX/THER AGNT PARAVERT FACET JOINT, CERV/THORAC, 1ST LEVEL       Left C45, C56, C67 diiag facets with Dr. Alvino Chapel     Standing Status:   Future     Standing Expiration Date:   01/15/2015   ??? PR INJ DX/THER AGNT PARAVERT FACET JOINT, CERV/THORAC, 2ND LEVEL       Left C45, C56, C67 diiag facets with Dr. Alvino Chapel       Standing Status:   Future     Standing Expiration Date:   01/15/2015   ??? PR INJ DX/THER AGNT PARAVERT FACET JOINT, CERV/THORAC, ADD LEVEL       Left C45, C56, C67 diiag facets with Dr. Alvino Chapel       Standing Status:   Future     Standing Expiration Date:   01/15/2015     No orders of the defined types were placed in this encounter.      Follow up:  Return in about 30 days (around 11/16/2014).    Irena Cords, MD

## 2014-10-18 ENCOUNTER — Ambulatory Visit
Admit: 2014-10-18 | Discharge: 2014-10-18 | Payer: PRIVATE HEALTH INSURANCE | Attending: Pain Medicine | Primary: Family Medicine

## 2014-10-18 DIAGNOSIS — M47812 Spondylosis without myelopathy or radiculopathy, cervical region: Secondary | ICD-10-CM

## 2014-10-18 NOTE — Progress Notes (Signed)
[facility]  Spine Surgery  Advanced Pain Management           Provider: Fernand Parkins, MD          Patient Name: Deborah Crosby DOB: 23-Feb-1956        Date: 10/18/2014         PROCEDURE: Left C3, C4, C5 and C6 medial branch blocks, diagnostic.    Description of Procedure:  The procedure and potential complications were explained to the patient and a voluntary informed, signed consent was obtained.  The patient was placed in right decubitus position and the left side of the neck was prepped with Betadine solution.  Under fluoroscopic guidance the left C3, C4, C5 and C6 articular pillars were clearly identified and a 25-gauge, 3.5 inch long curved spinal needle was inserted aiming at the medial branch located on the respective articular pillars of left C3, C4, C5 and C6. After negative aspiration 0.7 mL of 1% Xylocaine was injected.  The patient tolerated the procedure very well. Fifteen minutes later the pain relief was assessed. The patient states that the pain relief was 80%. The patient is a candidate for RF ablation. She has failed conservative treatment in the past. Anatomic model of pathology was shown. Risks and benefits of the procedure were discussed. All questions were answered and patient understands and agrees with the plan.        Summary and Recommendations:  Discussed about the next step which will be RFA. The patient understands and wishes to proceed.                       [address], [address] [place], [place] [address]  Phone [phone]/Fax [phone]/[email or web address]

## 2014-10-26 ENCOUNTER — Ambulatory Visit
Admit: 2014-10-26 | Discharge: 2014-10-26 | Payer: PRIVATE HEALTH INSURANCE | Attending: Pain Medicine | Primary: Family Medicine

## 2014-10-26 DIAGNOSIS — M47812 Spondylosis without myelopathy or radiculopathy, cervical region: Secondary | ICD-10-CM

## 2014-10-26 MED ORDER — DEXAMETHASONE SOD PHOSPHATE PF 10 MG/ML IJ SOLN
10 MG/ML | Freq: Once | INTRAMUSCULAR | Status: AC
Start: 2014-10-26 — End: 2014-10-26
  Administered 2014-10-26: 14:00:00 10 mg

## 2014-10-26 NOTE — Progress Notes (Signed)
[facility]  Spine Surgery  Advanced Pain Management           Provider: Fernand Parkins, MD          Patient Name: Deborah Crosby DOB: 08/01/1955        Date: 10/26/2014         PROCEDURE: Left C3, C4, C5 and C6 medial branch ablation by radiofrequency.    Description of Procedure:  The procedure and potential complications were explained to the patient and a voluntary informed, signed consent was obtained.  The patient was placed in right decubitus position and the left side of the neck was prepped with Betadine solution.  Under fluoroscopic guidance the skin was infiltrated with 2% Xylocaine and a 20-gauge, 3.5 inch long cannula with a 10 mm active curved tip was inserted to place the cannula tip on the medial branches located on the respective articular pillar.  After sensory and motor tests were done 2 mL of 2% Xylocaine was injected.  Ablation was carried out at 80 degrees Celsius for 75 seconds and it was repeated one more time by repositioning the cannula tip.  2 mg of Decadron was injected at each level before the removal of the cannula.  The patient tolerated the procedure very well.  Home-going instructions were given.     Summary and Recommendations:  We will see the patient in the office for a follow-up.                      [address] [place], [place] [address]  Phone [phone]/Fax [phone]/[email or web address]

## 2014-10-27 ENCOUNTER — Encounter

## 2014-10-27 MED ORDER — LANSOPRAZOLE 30 MG PO CPDR
30 MG | ORAL_CAPSULE | ORAL | 4 refills | Status: DC
Start: 2014-10-27 — End: 2015-05-06

## 2014-10-27 NOTE — Telephone Encounter (Signed)
Last seen 08-24-14

## 2014-11-01 ENCOUNTER — Encounter: Attending: Pain Medicine | Primary: Family Medicine

## 2014-11-15 NOTE — Telephone Encounter (Signed)
Please call pt to reschedule appt, has a work conflict.    See msg below

## 2014-11-15 NOTE — Telephone Encounter (Signed)
-----   Message from Ganado. Brenner sent at 11/15/2014  4:04 PM EDT -----  Regarding: Visit Follow-Up Question  Contact: [phone]  I tried to call, but got v.m.  I have an appointment with Dr. Chales Abrahams on Mon. 11/20/14, but I have been scheduled for mandatory training at work on that day.  I will be out of training @ 2:00 pm; my appt. with you is 1:00 pm.  If you can reschedule same day, after 3:00, I can make it.  Otherwise I'll need a different day.  You can leave a message on my cell if you call before 3, or call after 3:15.  (I work at the [employer] & no phones aloud)     Thanx!!  :)

## 2014-11-16 NOTE — Telephone Encounter (Signed)
Called pt left vm for pt to call back

## 2014-11-30 ENCOUNTER — Encounter

## 2014-11-30 MED ORDER — ATENOLOL-CHLORTHALIDONE 50-25 MG PO TABS
50-25 MG | ORAL_TABLET | ORAL | 1 refills | Status: DC
Start: 2014-11-30 — End: 2015-06-09

## 2014-12-04 ENCOUNTER — Ambulatory Visit
Admit: 2014-12-04 | Discharge: 2014-12-04 | Payer: PRIVATE HEALTH INSURANCE | Attending: Adult Health | Primary: Family Medicine

## 2014-12-04 DIAGNOSIS — M47812 Spondylosis without myelopathy or radiculopathy, cervical region: Secondary | ICD-10-CM

## 2014-12-04 MED ORDER — METHYLPREDNISOLONE 4 MG PO TBPK
4 MG | PACK | ORAL | 0 refills | Status: AC
Start: 2014-12-04 — End: 2014-12-10

## 2014-12-04 NOTE — Progress Notes (Signed)
Deborah Crosby  (1956-02-11)    12/04/14    Subjective:     Deborah Crosby is 59 y.o. female who complains today of:    Chief Complaint   Patient presents with   ??? Back Pain         Allergies:  Review of patient's allergies indicates no known allergies.    Past Medical History   Diagnosis Date   ??? Family history of premature CAD 02/07/2014   ??? First degree AV block 02/07/2014   ??? GERD (gastroesophageal reflux disease)    ??? Gout    ??? Hypertension    ??? Migraine headache    ??? Migraine headache    ??? Precordial pain 02/07/2014     Past Surgical History   Procedure Laterality Date   ??? Appendectomy     ??? Tonsillectomy     ??? Colonoscopy  09/21/2009   ??? Hysterectomy       partial   ??? Upper gastrointestinal endoscopy  09/21/2009   ??? Colonoscopy  05/30/13     DR LISI     Family History   Problem Relation Age of Onset   ??? Cancer Mother      breast   ??? Heart Disease Father    ??? Cancer Father      colon     Social History     Social History   ??? Marital status: Married     Spouse name: Deborah Crosby   ??? Number of children: 2   ??? Years of education: N/A     Occupational History   ??? Nurse Other     State of [place]      Social History Main Topics   ??? Smoking status: Never Smoker   ??? Smokeless tobacco: Never Used   ??? Alcohol use No   ??? Drug use: No   ??? Sexual activity: Not on file     Other Topics Concern   ??? Not on file     Social History Narrative       Current Outpatient Prescriptions on File Prior to Visit   Medication Sig Dispense Refill   ??? atenolol-chlorthalidone (TENORETIC) 50-25 MG per tablet TAKE ONE TABLET BY MOUTH ONCE DAILY 90 tablet 1   ??? lansoprazole (PREVACID) 30 MG capsule TAKE ONE CAPSULE BY MOUTH ONCE DAILY 30 capsule 4   ??? PREMARIN 0.3 MG tablet TAKE ONE TABLET BY MOUTH ONCE DAILY 30 tablet 3   ??? aspirin EC 81 MG EC tablet Take 1 tablet by mouth daily 30 tablet 3   ??? SUMAtriptan (IMITREX) 100 MG tablet Take 1 tablet by mouth as needed 27 tablet 3   ??? cyclobenzaprine (FLEXERIL) 10 MG tablet Take 1 tablet by mouth 3 times  daily as needed 270 tablet 3   ??? diclofenac (VOLTAREN) 75 MG EC tablet Take 1 tablet by mouth 2 times daily 180 tablet 3   ??? Multiple Vitamin (MULTIVITAMIN PO) Take  by mouth.         No current facility-administered medications on file prior to visit.        She has tried physical therapy.   Date of last of last PT treatment: about a year ago for the back    HPI Comments: Pt presents today for a f/u of her pain.  Pt last saw Dr [name] for her Lt C3,4,5,6 RF ablation without much relief. She has Hx of shoulder surgery twice with Dr. [name] and was told her shoulder  is "fine" now, and her pain was coming from her neck. She saw Dr. [name] for her initial consult for her left sided neck pain. She reports now she is having burning pain into the Lt shoulder blade. She denies radiating N&T. Denies HA.  Pt feels pain level as an 8-9/10 when it radiates into her shoulder blade.  She says the pain is about a 4/10 currently.  Pt feels that working at the computer, flexion her head makes the pain worse, and dropping her arms down to her side and rest makes the pain better. She has tried Alieve for her pain with some relief.  She has seen Dr. [name] years ago and tried traction in the past without any relief and made her Sx worse. She chose to avoid surgery at this time and this was > 10 years ago she reports.    Back Pain   Associated symptoms include headaches. Pertinent negatives include no fever.       Review of Systems   Constitutional: Negative for fever.   HENT: Negative for hearing loss.    Respiratory: Negative for shortness of breath.    Gastrointestinal: Negative for constipation, diarrhea and nausea.   Genitourinary: Negative for difficulty urinating.   Musculoskeletal: Positive for back pain and neck pain. Negative for arthralgias.   Skin: Negative for rash.   Neurological: Positive for headaches.   Hematological: Bruises/bleeds easily.   Psychiatric/Behavioral: Negative for sleep disturbance.   All other systems  reviewed and are negative.        Objective:     Vitals:  Visit Vitals   ??? Temp 97.3 ??F (36.3 ??C)   ??? Ht '5\' 2"'  (1.575 m)   ??? Wt 150 lb (68 kg)   ??? LMP 05/29/2003   ??? BMI 27.44 kg/m2   Pain Score:   4  BMI is within normal limits      Physical Exam   Nursing note and vitals reviewed.  This is a pleasant female who answers questions appropriately and follows commands.  Pt is alert and oriented x 3.  Recent and remote memory is intact.  Mood and affect, judgement and insight are normal.  No signs of distress, no dyspnea or SOB noted. HEENT: PERRL.  Neck is supple, trachea midline.  No lymphadenopathy noted.  Decreased ROM with flexion, extension and rotation of cervical spine. Tightness in trapezius with palpation noted. Mildly tender with palpation over cervical spine on the Lt.  Negative Spurling's maneuver.  Negative Hoffmann's sign. Strong grasp B/L.  Pulses are intact.   Cranial nerves II-XII are intact.      Assessment:     1. Cervical spondylosis without myelopathy  XR Cervical Spine 4 Or 5 Vw    methylPREDNISolone (MEDROL, PAK,) 4 MG tablet       Plan:     Orders Placed This Encounter   Medications   ??? methylPREDNISolone (MEDROL, PAK,) 4 MG tablet     Sig: Take by mouth. Take as directed. Do not use NSAIDS while taking as discussed     Dispense:  1 kit     Refill:  0   Discussed options with the patient today. Will start a trial of medrol dose pack take as directed (#1 pack, 0RF). Ordered XR of cervical spine with flexion and extension views to assess for any instability. Discussed starting trial of PT for her neck, but pt refused at this time and would like to wait on this if possible. She will f/u  in one month to reassess pain. She will call sooner if worsening Sx as discussed.   Anatomic model pathology was shown and reviewed with pt.  All questions were answered. When applicable, home exercise program was discussed, smoking cessation discussed, relevant imaging reviewed,  and pain generators reviewed. Pt  verbalized understanding and agrees with above plan. Marland Kitchen  [place] was reviewed.  This NP saw pt under direct supervision of Dr. [name].  Controlled Substances Monitoring: Attestation: The Prescription Monitoring Report for this patient was reviewed today. (Janeliz Prestwood L Margean Korell, NP)  Documentation: No signs of potential drug abuse or diversion identified. (Alyscia Carmon L Ikhlas Albo, NP)      Orders Placed This Encounter   Procedures   ??? XR Cervical Spine 4 Or 5 Vw     Standing Status:   Future     Standing Expiration Date:   03/04/2015     Scheduling Instructions:      Xray cervical AP Lateral and Lateral Flexion Extension     Order Specific Question:   Reason for exam:     Answer:   worsening Lt sided neck pain with burning.         Follow up:  Return in about 4 weeks (around 01/01/2015) for review meds and reassess pain, F/U Dr. [name].    Natalia Leatherwood Tai Skelly, NP

## 2015-01-03 ENCOUNTER — Ambulatory Visit
Admit: 2015-01-03 | Discharge: 2015-01-03 | Payer: PRIVATE HEALTH INSURANCE | Attending: Adult Health | Primary: Family Medicine

## 2015-01-03 DIAGNOSIS — M47812 Spondylosis without myelopathy or radiculopathy, cervical region: Secondary | ICD-10-CM

## 2015-01-03 NOTE — Progress Notes (Signed)
Deborah BergamoBarbara M Crosby  (December 10, 1955)    01/03/15    Subjective:     Harlin HeysBarbara M Marin is 59 y.o. female who complains today of:    Chief Complaint   Patient presents with   ??? Neck Pain         Allergies:  Review of patient's allergies indicates no known allergies.    Past Medical History   Diagnosis Date   ??? Family history of premature CAD 02/07/2014   ??? First degree AV block 02/07/2014   ??? GERD (gastroesophageal reflux disease)    ??? Gout    ??? Hypertension    ??? Migraine headache    ??? Migraine headache    ??? Precordial pain 02/07/2014     Past Surgical History   Procedure Laterality Date   ??? Appendectomy     ??? Tonsillectomy     ??? Colonoscopy  09/21/2009   ??? Hysterectomy       partial   ??? Upper gastrointestinal endoscopy  09/21/2009   ??? Colonoscopy  05/30/13     DR LISI     Family History   Problem Relation Age of Onset   ??? Cancer Mother      breast   ??? Heart Disease Father    ??? Cancer Father      colon     Social History     Social History   ??? Marital status: Married     Spouse name: Bing PlumeJames Slifer   ??? Number of children: 2   ??? Years of education: N/A     Occupational History   ??? 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 Smoking status: Never Smoker   ??? Smokeless tobacco: Never Used   ??? Alcohol use No   ??? Drug use: No   ??? Sexual activity: Not on file     Other Topics Concern   ??? Not on file     Social History Narrative       Current Outpatient Prescriptions on File Prior to Visit   Medication Sig Dispense Refill   ??? aspirin EC 81 MG EC tablet Take 1 tablet by mouth daily 30 tablet 3   ??? cyclobenzaprine (FLEXERIL) 10 MG tablet Take 1 tablet by mouth 3 times daily as needed 270 tablet 3   ??? atenolol-chlorthalidone (TENORETIC) 50-25 MG per tablet TAKE ONE TABLET BY MOUTH ONCE DAILY 90 tablet 1   ??? lansoprazole (PREVACID) 30 MG capsule TAKE ONE CAPSULE BY MOUTH ONCE DAILY 30 capsule 4   ??? PREMARIN 0.3 MG tablet TAKE ONE TABLET BY MOUTH ONCE DAILY 30 tablet 3   ??? SUMAtriptan (IMITREX) 100 MG tablet Take 1 tablet  by mouth as needed 27 tablet 3   ??? diclofenac (VOLTAREN) 75 MG EC tablet Take 1 tablet by mouth 2 times daily 180 tablet 3   ??? Multiple Vitamin (MULTIVITAMIN PO) Take  by mouth.         No current facility-administered medications on file prior to visit.        She has tried physical therapy.   Date of last of last PT treatment: 1.5 years ago for shoulder     HPI Comments: Pt presents today for a f/u of her pain.  Pt last saw this NP for her Lt sided neck pain. She did not start the medrol dose pack as she reports she isn't interested in taking any steroids. She  just remembered to have her XR of her neck done today before her appointment. She denies pain shooting down her arms or N&T radiating.  She says she has pain into her shoulder blades when she is sitting at work on the computer. If her elbows are down and not up on the chair rests which helps.  Pt feels pain level 4/10. She uses her Alieve PRN.  Pt feels that movement of her neck, and resting elbows on desk makes the pain worse, and adjusting her seat at work, Flexeril and [place] makes the pain better.  Pt feels her medication helps   her function and improve her quality of life. Denies new injuries or weakness.  She had RF ablation of her cervical spine without relief she reports.     Neck Pain    Associated symptoms include headaches. Pertinent negatives include no fever.       Review of Systems   Constitutional: Negative for fever.   HENT: Negative for hearing loss.    Respiratory: Negative for shortness of breath.    Gastrointestinal: Negative for constipation, diarrhea and nausea.   Genitourinary: Negative for difficulty urinating.   Musculoskeletal: Positive for back pain. Negative for neck pain.   Skin: Negative for rash.   Neurological: Positive for headaches.   Hematological: Bruises/bleeds easily.   Psychiatric/Behavioral: Negative for sleep disturbance.         Objective:     Vitals:  Visit Vitals   ??? Temp 96.6 ??F (35.9 ??C) (Tympanic)   ??? Ht   (1.575 m)   ??? Wt 160 lb (72.6 kg)   ??? LMP 05/29/2003   ??? BMI 29.26 kg/m2   Pain Score:   4  BMI is within normal limits      Physical Exam   Nursing note and vitals reviewed.  This is a pleasant female who answers questions appropriately and follows commands.  Pt is alert and oriented x 3.  Recent and remote memory is intact.  Mood and affect, judgement and insight are normal.  No signs of distress, no dyspnea or SOB noted. HEENT: PERRL.  Neck is supple, trachea midline.  No lymphadenopathy noted.  Decreased ROM with flexion, extension and rotation of cervical spine. Tightness in trapezius with palpation noted. Tender with palpation over cervical spine. Mildly Positive cervical facet joint provacative noted with palpation on left. Negative Spurling's maneuver.  Negative Hoffmann's sign. Strong grasp B/L.  Pulses are intact.   Cranial nerves II-XII are intact.      Assessment:     1. Cervical spondylosis without myelopathy         Plan:     No orders of the defined types were placed in this encounter.  Discussed options with the patient today. Will hold on injections at this time. She is trying to avoid surgery. She is not interested in more PT as this increased her pain in the past. She says her pain is manageable at this time. She may need an updated MRI in the future.  Will review her XR of her cervical spine when I receive them. Anatomic model pathology was shown and reviewed with pt.  All questions were answered. When applicable, home exercise program was discussed, smoking cessation discussed, relevant imaging reviewed, NDP reviewed and pain generators reviewed. Pt verbalized understanding and agrees with above plan.  OARRS was reviewed.  This NP saw pt under direct supervision of Dr. Lucianne Muss.  Controlled Substances Monitoring: Attestation: The Prescription Monitoring Report for this  patient was reviewed today. (Jenniferann Stuckert L Riku Buttery, NP)  Documentation: Possible medication side effects, risk of tolerance and/or  dependence, and alternative treatments discussed, No signs of potential drug abuse or diversion identified. (Lewin Pellow L Dimarco Minkin, NP)      No orders of the defined types were placed in this encounter.        Follow up:  Return if symptoms worsen or fail to improve.    Lauralyn Primes Haim Hansson, NP

## 2015-01-08 NOTE — Telephone Encounter (Signed)
-----   Message from Tiffany Kocheraryn L Delisio, NP sent at 01/08/2015 12:55 PM EDT -----  Please tell pt XR reviewed of her neck showing arthritis. Will discuss further at next OV.

## 2015-01-08 NOTE — Telephone Encounter (Signed)
CALLED PT LEFT RESULTS ON VM

## 2015-02-05 MED ORDER — PREMARIN 0.3 MG PO TABS
0.3 MG | ORAL_TABLET | ORAL | 3 refills | Status: DC
Start: 2015-02-05 — End: 2015-10-01

## 2015-02-05 NOTE — Telephone Encounter (Signed)
Last seen 08/2014

## 2015-04-04 ENCOUNTER — Encounter: Attending: Family Medicine | Primary: Family Medicine

## 2015-04-04 ENCOUNTER — Encounter

## 2015-04-04 ENCOUNTER — Ambulatory Visit
Admit: 2015-04-04 | Discharge: 2015-04-04 | Payer: PRIVATE HEALTH INSURANCE | Attending: Family Medicine | Primary: Family Medicine

## 2015-04-04 DIAGNOSIS — Z0000 Encounter for general adult medical examination without abnormal findings: Secondary | ICD-10-CM

## 2015-04-04 LAB — CBC WITH AUTO DIFFERENTIAL
Basophils %: 1.1 %
Basophils Absolute: 0.1 10*3/uL (ref 0.0–0.2)
Eosinophils %: 2.5 %
Eosinophils Absolute: 0.1 10*3/uL (ref 0.0–0.7)
Hematocrit: 39.4 % (ref 37.0–47.0)
Hemoglobin: 13 g/dL (ref 12.0–16.0)
Lymphocytes %: 43.4 %
Lymphocytes Absolute: 2.5 10*3/uL (ref 1.0–4.8)
MCH: 28.6 pg (ref 27.0–31.3)
MCHC: 33 % (ref 33.0–37.0)
MCV: 86.7 fL (ref 82.0–100.0)
Monocytes %: 9.1 %
Monocytes Absolute: 0.5 10*3/uL (ref 0.2–0.8)
Neutrophils %: 43.9 %
Neutrophils Absolute: 2.5 10*3/uL (ref 1.4–6.5)
Platelets: 203 10*3/uL (ref 130–400)
RBC: 4.54 M/uL (ref 4.20–5.40)
RDW: 13.5 % (ref 11.5–14.5)
WBC: 5.7 10*3/uL (ref 4.8–10.8)

## 2015-04-04 LAB — COMPREHENSIVE METABOLIC PANEL
ALT: 17 U/L (ref 0–33)
AST: 22 U/L (ref 0–35)
Albumin: 4.5 g/dL (ref 3.9–4.9)
Alkaline Phosphatase: 50 U/L (ref 40–130)
Anion Gap: 13 mEq/L (ref 7–13)
BUN: 15 mg/dL (ref 8–23)
CO2: 25 mEq/L (ref 22–29)
Calcium: 8.9 mg/dL (ref 8.6–10.2)
Chloride: 101 mEq/L (ref 98–107)
Creatinine: 0.93 mg/dL — ABNORMAL HIGH (ref 0.50–0.90)
GFR African American: >60 (ref >60)
GFR Non-African American: >60 (ref >60)
Globulin: 2.7 g/dL (ref 2.3–3.5)
Glucose: 96 mg/dL (ref 74–109)
Potassium: 4.3 mEq/L (ref 3.5–5.1)
Sodium: 139 mEq/L (ref 132–144)
Total Bilirubin: 0.2 mg/dL (ref 0.0–1.2)
Total Protein: 7.2 g/dL (ref 6.4–8.1)

## 2015-04-04 LAB — LIPID PANEL
Cholesterol, Total: 219 mg/dL — ABNORMAL HIGH (ref 0–199)
HDL: 86 mg/dL — ABNORMAL HIGH (ref 40–59)
LDL Calculated: 99 mg/dL (ref 0–129)
Triglycerides: 172 mg/dL (ref 0–200)

## 2015-04-04 MED ORDER — ACETAMINOPHEN-CODEINE 300-30 MG PO TABS
300-30 MG | ORAL_TABLET | Freq: Four times a day (QID) | ORAL | 0 refills | Status: DC | PRN
Start: 2015-04-04 — End: 2017-04-23

## 2015-04-04 MED ORDER — SUMATRIPTAN SUCCINATE 100 MG PO TABS
100 | ORAL_TABLET | ORAL | 3 refills | Status: DC | PRN
Start: 2015-04-04 — End: 2016-05-05

## 2015-04-04 MED ORDER — PREDNISONE 10 MG PO TABS
10 MG | ORAL_TABLET | ORAL | 0 refills | Status: DC
Start: 2015-04-04 — End: 2015-08-01

## 2015-04-04 MED ORDER — NALTREXONE-BUPROPION HCL ER 8-90 MG PO TB12
8-90 MG | ORAL_TABLET | Freq: Two times a day (BID) | ORAL | 1 refills | Status: DC
Start: 2015-04-04 — End: 2015-08-01

## 2015-04-04 MED ORDER — ATENOLOL 50 MG PO TABS
50 MG | ORAL_TABLET | Freq: Every day | ORAL | 3 refills | Status: DC
Start: 2015-04-04 — End: 2016-04-03

## 2015-04-04 MED ORDER — CYCLOBENZAPRINE HCL 10 MG PO TABS
10 MG | ORAL_TABLET | Freq: Three times a day (TID) | ORAL | 3 refills | Status: DC | PRN
Start: 2015-04-04 — End: 2016-05-05

## 2015-04-04 MED ORDER — DICLOFENAC SODIUM 75 MG PO TBEC
75 MG | ORAL_TABLET | Freq: Two times a day (BID) | ORAL | 3 refills | Status: DC
Start: 2015-04-04 — End: 2016-09-28

## 2015-04-04 NOTE — Progress Notes (Signed)
Subjective:      Patient ID: Deborah Crosby is a 60 y.o. female.    HPI  Presents today for a wellness visit      Also complaining of Back pain for 2 Months.  This is gradually worsening.  The pain is moderate to severe at times.   Pain localizes to the mostly the lower right back, but will radiate across the lower back.   Denies trauma / injury.  Denies swelling.  Positive pain with range of motion.  Medication tried prior to visit : Norco, OTC Advil, OTC Naproxen, Ice, Heat, and Stretches with mild relief.  Would like to discuss changing the Norco to something else.    ALSO SOUL LIKE WEIGHT LOSS MED    Has no other questions or concerns    Health Maintenance Is Up-To-Date    Review of Systems   Constitutional: Negative for activity change, appetite change, chills, fatigue and fever.   HENT: Negative for ear pain.    Eyes: Negative.    Respiratory: Negative for cough and shortness of breath.    Cardiovascular: Negative for chest pain.   Gastrointestinal: Negative for abdominal pain, constipation, diarrhea, nausea and vomiting.   Genitourinary: Negative for dysuria and frequency.   Neurological: Negative for dizziness and headaches.     Visit Vitals   ??? BP 128/84   ??? Pulse 72   ??? Temp 98 ??F (36.7 ??C) (Temporal)   ??? Resp 12   ??? Ht  (1.575 m)   ??? Wt 173 lb 6.4 oz (78.7 kg)   ??? LMP 05/29/2003   ??? Breastfeeding No   ??? BMI 31.72 kg/m2     No Known Allergies  Outpatient Encounter Prescriptions as of 04/04/2015   Medication Sig Dispense Refill   ??? atenolol (TENORMIN) 50 MG tablet Take 1 tablet by mouth daily 90 tablet 3   ??? cyclobenzaprine (FLEXERIL) 10 MG tablet Take 1 tablet by mouth 3 times daily as needed for Muscle spasms 180 tablet 3   ??? diclofenac (VOLTAREN) 75 MG EC tablet Take 1 tablet by mouth 2 times daily 180 tablet 3   ??? SUMAtriptan (IMITREX) 100 MG tablet Take 1 tablet by mouth as needed for Migraine 27 tablet 3   ??? predniSONE (DELTASONE) 10 MG tablet TID FOR 5 DAYS THEN BID FOR 3 DAYS THEN 1 DAILY 24  tablet 0   ??? acetaminophen-codeine (TYLENOL/CODEINE #3) 300-30 MG per tablet Take 1-2 tablets by mouth every 6 hours as needed for Pain 40 tablet 0   ??? naltrexone-bupropion (CONTRAVE) 8-90 MG per extended release tablet Take 2 tablets by mouth 2 times daily 120 tablet 1   ??? PREMARIN 0.3 MG tablet TAKE ONE TABLET BY MOUTH ONCE DAILY 30 tablet 3   ??? Naproxen Sodium (ALEVE PO) Take by mouth     ??? atenolol-chlorthalidone (TENORETIC) 50-25 MG per tablet TAKE ONE TABLET BY MOUTH ONCE DAILY 90 tablet 1   ??? lansoprazole (PREVACID) 30 MG capsule TAKE ONE CAPSULE BY MOUTH ONCE DAILY 30 capsule 4   ??? aspirin EC 81 MG EC tablet Take 1 tablet by mouth daily 30 tablet 3   ??? Multiple Vitamin (MULTIVITAMIN PO) Take  by mouth.       ??? [DISCONTINUED] SUMAtriptan (IMITREX) 100 MG tablet Take 1 tablet by mouth as needed 27 tablet 3   ??? [DISCONTINUED] cyclobenzaprine (FLEXERIL) 10 MG tablet Take 1 tablet by mouth 3 times daily as needed 270 tablet 3   ??? [  DISCONTINUED] diclofenac (VOLTAREN) 75 MG EC tablet Take 1 tablet by mouth 2 times daily 180 tablet 3     No facility-administered encounter medications on file as of 04/04/2015.      Social History     Social History   ??? Marital status: Married     Spouse name: Briell Paulette   ??? Number of children: 2   ??? Years of education: N/A     Occupational History   ??? 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 Smoking status: Never Smoker   ??? Smokeless tobacco: Never Used   ??? Alcohol use No   ??? Drug use: No   ??? Sexual activity: Not on file     Other Topics Concern   ??? Not on file     Social History Narrative     Family History   Problem Relation Age of Onset   ??? Cancer Mother      breast   ??? Heart Disease Father    ??? Cancer Father      colon     Past Medical History   Diagnosis Date   ??? Family history of premature CAD 02/07/2014   ??? First degree AV block 02/07/2014   ??? GERD (gastroesophageal reflux disease)    ??? Gout    ??? Hypertension    ??? Migraine headache    ??? Migraine headache     ??? Precordial pain 02/07/2014     Past Surgical History   Procedure Laterality Date   ??? Appendectomy     ??? Tonsillectomy     ??? Colonoscopy  09/21/2009   ??? Hysterectomy       partial   ??? Upper gastrointestinal endoscopy  09/21/2009   ??? Colonoscopy  05/30/13     DR LISI         Objective:   Physical Exam   HENT:   Nose: Nose normal.   Mouth/Throat: Oropharynx is clear and moist.   Eyes: Conjunctivae are normal. Pupils are equal, round, and reactive to light.   Neck: Neck supple.   Cardiovascular: Normal rate, regular rhythm and normal heart sounds.    No murmur heard.  Pulmonary/Chest: Breath sounds normal. She has no wheezes.   Abdominal: Soft. She exhibits no mass. There is no tenderness.   Musculoskeletal: She exhibits no edema.        Lumbar back: She exhibits decreased range of motion and pain. She exhibits no bony tenderness.   Lymphadenopathy:     She has no cervical adenopathy.       Assessment:      1. Routine general medical examination at a health care facility  Comprehensive Metabolic Panel    CBC Auto Differential    Lipid Panel   2. Visit for screening mammogram  MAM Digital Screen Bilateral   3. Need for Tdap vaccination  Tdap vaccine greater than or equal to 7yo IM (ADACEL;BOOSTRIX)   4. Need for influenza vaccination  INFLUENZA, TRIVALENT, 4 YRS AND OLDER, IM (FLUVIRIN)   5. Essential hypertension controlled    6. Gastroesophageal reflux disease without esophagitis     7. Chronic pain syndrome     8. Sciatica of right side             Plan:      Orders Placed This Encounter   Procedures   ??? MAM Digital Screen Bilateral     Standing  Status:   Future     Standing Expiration Date:   04/23/2016     Order Specific Question:   Reason for exam:     Answer:   ROUTINE EXAM   ??? Tdap vaccine greater than or equal to 7yo IM (ADACEL;BOOSTRIX)   ??? INFLUENZA, TRIVALENT, 4 YRS AND OLDER, IM (FLUVIRIN)   ??? Comprehensive Metabolic Panel     Standing Status:   Future     Number of Occurrences:   1     Standing Expiration  Date:   04/03/2016   ??? CBC Auto Differential     Standing Status:   Future     Number of Occurrences:   1     Standing Expiration Date:   04/03/2016   ??? Lipid Panel     Standing Status:   Future     Number of Occurrences:   1     Standing Expiration Date:   04/03/2016     Order Specific Question:   Is Patient Fasting?/# of Hours     Answer:   8      Orders Placed This Encounter   Medications   ??? atenolol (TENORMIN) 50 MG tablet     Sig: Take 1 tablet by mouth daily     Dispense:  90 tablet     Refill:  3   ??? cyclobenzaprine (FLEXERIL) 10 MG tablet     Sig: Take 1 tablet by mouth 3 times daily as needed for Muscle spasms     Dispense:  180 tablet     Refill:  3   ??? diclofenac (VOLTAREN) 75 MG EC tablet     Sig: Take 1 tablet by mouth 2 times daily     Dispense:  180 tablet     Refill:  3   ??? SUMAtriptan (IMITREX) 100 MG tablet     Sig: Take 1 tablet by mouth as needed for Migraine     Dispense:  27 tablet     Refill:  3   ??? predniSONE (DELTASONE) 10 MG tablet     Sig: TID FOR 5 DAYS THEN BID FOR 3 DAYS THEN 1 DAILY     Dispense:  24 tablet     Refill:  0   ??? acetaminophen-codeine (TYLENOL/CODEINE #3) 300-30 MG per tablet     Sig: Take 1-2 tablets by mouth every 6 hours as needed for Pain     Dispense:  40 tablet     Refill:  0   ??? naltrexone-bupropion (CONTRAVE) 8-90 MG per extended release tablet     Sig: Take 2 tablets by mouth 2 times daily     Dispense:  120 tablet     Refill:  1      long talk  Health maintenance issues reviewed and discussed with recommendations made  The importance of a good diet and exercise regimen discussed.   Avoid activity that exacerbates symptoms.   Declines PT   Take medication as prescribed.  Follow up as instructed.  Call if any problems.

## 2015-04-10 NOTE — Progress Notes (Signed)
CBC-WNL  KIDNEY FUNCTION--WNL  LIVER FUNCTION--WNL  POTASSIUM--WNL  CHOLESTEROL / LIPIDS--WNL- you have a lot of good cholesterol        Follow up as instructed

## 2015-05-03 ENCOUNTER — Inpatient Hospital Stay: Admit: 2015-05-03 | Discharge: 2015-05-03 | Payer: PRIVATE HEALTH INSURANCE | Primary: Family Medicine

## 2015-05-03 DIAGNOSIS — Z1231 Encounter for screening mammogram for malignant neoplasm of breast: Secondary | ICD-10-CM

## 2015-05-03 NOTE — Progress Notes (Signed)
MAM WNL/STABLE.  Recheck in 1 year.    Notify patient.

## 2015-05-06 ENCOUNTER — Encounter

## 2015-05-07 MED ORDER — LANSOPRAZOLE 30 MG PO CPDR
30 MG | ORAL_CAPSULE | ORAL | 3 refills | Status: DC
Start: 2015-05-07 — End: 2015-09-04

## 2015-05-31 ENCOUNTER — Encounter: Attending: Internal Medicine | Primary: Family Medicine

## 2015-06-09 ENCOUNTER — Encounter

## 2015-06-09 NOTE — Telephone Encounter (Signed)
Last seen 03/2015

## 2015-06-11 MED ORDER — ATENOLOL-CHLORTHALIDONE 50-25 MG PO TABS
50-25 MG | ORAL_TABLET | ORAL | 2 refills | Status: DC
Start: 2015-06-11 — End: 2016-03-08

## 2015-08-01 ENCOUNTER — Ambulatory Visit
Admit: 2015-08-01 | Discharge: 2015-08-01 | Payer: PRIVATE HEALTH INSURANCE | Attending: Family Medicine | Primary: Family Medicine

## 2015-08-01 ENCOUNTER — Encounter

## 2015-08-01 DIAGNOSIS — M47812 Spondylosis without myelopathy or radiculopathy, cervical region: Secondary | ICD-10-CM

## 2015-08-01 MED ORDER — PREDNISONE 10 MG PO TABS
10 MG | ORAL_TABLET | ORAL | 0 refills | Status: AC
Start: 2015-08-01 — End: 2015-08-11

## 2015-08-01 NOTE — Progress Notes (Signed)
Subjective:      Patient ID: Deborah Crosby is a 60 y.o. female.    HPI  Presents today complaining of Joint pain for 1 Month.  This is gradually worsening.  The pain has been constant for 1 and a half weeks.  The pain is moderate to severe at times.   Pain localizes to the both shoulder, both hips, both knees, and her neck.   Denies trauma / injury.  Denies swelling.  Positive pain with range of motion.  Medication tried prior to visit : Voltaren, Norco, and Flexeril with no relief.    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   HENT: Negative for ear pain.    Eyes: Negative.    Respiratory: Negative for cough and shortness of breath.    Cardiovascular: Negative for chest pain and palpitations.   Gastrointestinal: Negative for abdominal pain, constipation, diarrhea and nausea.   Genitourinary: Negative for dysuria and frequency.   Neurological: Negative for dizziness and headaches.     BP 130/86   Pulse 64   Temp 97.1 ??F (36.2 ??C) (Temporal)    Resp 12   Ht 5' 2.25" (1.581 m)   Wt 171 lb 9.6 oz (77.8 kg)   LMP 05/29/2003   Breastfeeding? No   BMI 31.13 kg/m2  No Known Allergies  Outpatient Encounter Prescriptions as of 08/01/2015   Medication Sig Dispense Refill   ??? predniSONE (DELTASONE) 10 MG tablet TID FOR 5 DAYS THEN BID FOR 3 DAYS THEN 1 DAILY 24 tablet 0   ??? atenolol-chlorthalidone (TENORETIC) 50-25 MG per tablet TAKE ONE TABLET BY MOUTH ONCE DAILY 90 tablet 2   ??? lansoprazole (PREVACID) 30 MG delayed release capsule TAKE ONE CAPSULE BY MOUTH ONCE DAILY 30 capsule 3   ??? atenolol (TENORMIN) 50 MG tablet Take 1 tablet by mouth daily 90 tablet 3   ??? cyclobenzaprine (FLEXERIL) 10 MG tablet Take 1 tablet by mouth 3 times daily as needed for Muscle spasms 180 tablet 3   ??? diclofenac (VOLTAREN) 75 MG EC tablet  Take 1 tablet by mouth 2 times daily 180 tablet 3   ??? SUMAtriptan (IMITREX) 100 MG tablet Take 1 tablet by mouth as needed for Migraine 27 tablet 3   ??? acetaminophen-codeine (TYLENOL/CODEINE #3) 300-30 MG per tablet Take 1-2 tablets by mouth every 6 hours as needed for Pain 40 tablet 0   ??? PREMARIN 0.3 MG tablet TAKE ONE TABLET BY MOUTH ONCE DAILY 30 tablet 3   ??? Naproxen Sodium (ALEVE PO) Take by mouth     ??? aspirin EC 81 MG EC tablet Take 1 tablet by mouth daily 30 tablet 3   ??? Multiple Vitamin (MULTIVITAMIN PO) Take  by mouth.       ??? [DISCONTINUED] naltrexone-bupropion (CONTRAVE) 8-90 MG per extended release tablet Take 2 tablets by mouth 2 times daily 120 tablet 1     No facility-administered encounter medications on file as of 08/01/2015.      Social History     Social History   ??? Marital status: Married     Spouse name: Norberta Stobaugh   ??? Number of children: 2   ??? Years of education: N/A     Occupational History   ???  [employer] [place] [employer]     Social History Main Topics   ??? Smoking status: Never Smoker   ??? Smokeless tobacco: Never Used   ??? Alcohol use No   ??? Drug use: No   ??? Sexual activity: Not on file     Other Topics Concern   ??? Not on file     Social History Narrative     Family History   Problem Relation Age of Onset   ??? Cancer Mother      breast   ??? Heart Disease Father    ??? Cancer Father      colon     Past Medical History:   Diagnosis Date   ??? Family history of premature CAD 02/07/2014   ??? First degree AV block 02/07/2014   ??? GERD (gastroesophageal reflux disease)    ??? Gout    ??? Hypertension    ??? Migraine headache    ??? Migraine headache    ??? Precordial pain 02/07/2014     Past Surgical History:   Procedure Laterality Date   ??? APPENDECTOMY     ??? COLONOSCOPY  09/21/2009   ??? COLONOSCOPY  05/30/13    DR LISI   ??? HYSTERECTOMY      partial   ??? TONSILLECTOMY     ??? UPPER GASTROINTESTINAL ENDOSCOPY  09/21/2009         Objective:   Physical Exam   HENT:   Nose: Nose normal.   Mouth/Throat: Oropharynx is clear  and moist.   Eyes: Conjunctivae are normal. Pupils are equal, round, and reactive to light.   Neck: Normal range of motion. Neck supple. No thyromegaly present.   Mild low neck pain with range of motion   Cardiovascular: Normal rate, regular rhythm and normal heart sounds.    No murmur heard.  Pulmonary/Chest: Breath sounds normal. She has no wheezes.   Abdominal: Soft. She exhibits no mass. There is no tenderness.   Musculoskeletal: She exhibits no edema.        Lumbar back: She exhibits decreased range of motion and pain. She exhibits no bony tenderness.   MOD ANTERIOR THAT SHOULDER PAIN WITH ROM   OA changes hands  Minimal pain right hip with range of motion   Lymphadenopathy:     She has no cervical adenopathy.   Psychiatric: She has a normal mood and affect.       Assessment:      1. Cervical spondylosis without myelopathy  predniSONE (DELTASONE) 10 MG tablet   2. Essential hypertension controlled    3. Primary osteoarthritis involving multiple joints  predniSONE (DELTASONE) 10 MG tablet   4. Right hip pain  predniSONE (DELTASONE) 10 MG tablet   5. Possible exposure to STD  Hepatitis C Antibody    Hiv-1,-2 W/Reflex To Hiv-1 Western Blot   6. Postmenopausal  DEXA Bone Density Axial Skeleton    DEXA Bone Density Axial Skeleton           Plan:      Orders Placed This Encounter   Procedures   ??? DEXA Bone Density Axial Skeleton     Standing Status:   Future     Standing Expiration Date:   07/31/2016   ??? DEXA Bone Density Axial Skeleton     Standing Status:   Future     Standing Expiration Date:   07/31/2016   ??? Hepatitis C Antibody     Standing Status:   Future  Number of Occurrences:   1     Standing Expiration Date:   07/31/2016   ??? Hiv-1,-2 W/Reflex To Hiv-1 Western Blot     Standing Status:   Future     Number of Occurrences:   1     Standing Expiration Date:   07/31/2016      Orders Placed This Encounter   Medications   ??? predniSONE (DELTASONE) 10 MG tablet     Sig: TID FOR 5 DAYS THEN BID FOR 3 DAYS THEN 1  DAILY     Dispense:  24 tablet     Refill:  0        Long talk.  Avoid activities that exacerbate symptoms  Advised to really rest the next 3-4 days and slowly increase activity  The importance of a good diet and exercise regimen discussed.  Take medications as prescribed.    Follow up as instructed.  Call if any problems                        Please note this report has been partially produced using speech recognition software  And may cause contain errors related to that system including grammar, punctuation and spelling as well as words and phrases that may seem inappropriate. If there are questions or concerns please feel free to contact me to clarify.

## 2015-08-02 LAB — HEPATITIS C ANTIBODY: Hep C Ab Interp: NONREACTIVE

## 2015-08-03 LAB — HIV-1,-2 W/REFLEX TO HIV-1 WESTERN BLOT: HIV-1/HIV-2 Ab: NEGATIVE

## 2015-08-04 NOTE — Progress Notes (Signed)
HIV and hepatitis C tests are negative    Notify pt.

## 2015-08-16 ENCOUNTER — Inpatient Hospital Stay: Admit: 2015-08-16 | Payer: PRIVATE HEALTH INSURANCE | Primary: Family Medicine

## 2015-08-16 DIAGNOSIS — Z780 Asymptomatic menopausal state: Secondary | ICD-10-CM

## 2015-08-16 NOTE — Progress Notes (Signed)
Bone density is normal.  Okay for daily vitamin D and every other day calcium    Notify pt.

## 2015-09-04 ENCOUNTER — Encounter

## 2015-09-05 MED ORDER — LANSOPRAZOLE 30 MG PO CPDR
30 MG | ORAL_CAPSULE | ORAL | 0 refills | Status: DC
Start: 2015-09-05 — End: 2015-10-01

## 2015-09-05 NOTE — Telephone Encounter (Signed)
Last seen 08-01-15

## 2015-10-01 ENCOUNTER — Encounter

## 2015-10-02 MED ORDER — LANSOPRAZOLE 30 MG PO CPDR
30 MG | ORAL_CAPSULE | ORAL | 3 refills | Status: DC
Start: 2015-10-02 — End: 2016-02-04

## 2015-10-02 MED ORDER — PREMARIN 0.3 MG PO TABS
0.3 MG | ORAL_TABLET | ORAL | 3 refills | Status: DC
Start: 2015-10-02 — End: 2016-03-18

## 2015-10-02 NOTE — Telephone Encounter (Signed)
Last seen 5-17  Dr.Hiti pt

## 2015-10-18 ENCOUNTER — Encounter: Admit: 2015-10-18 | Discharge: 2015-10-18 | Payer: PRIVATE HEALTH INSURANCE | Primary: Family Medicine

## 2015-10-18 ENCOUNTER — Ambulatory Visit
Admit: 2015-10-18 | Discharge: 2015-10-18 | Payer: PRIVATE HEALTH INSURANCE | Attending: Family Medicine | Primary: Family Medicine

## 2015-10-18 DIAGNOSIS — M25551 Pain in right hip: Secondary | ICD-10-CM

## 2015-10-18 MED ORDER — METHYLPREDNISOLONE 4 MG PO TBPK
4 MG | PACK | ORAL | 0 refills | Status: AC
Start: 2015-10-18 — End: 2015-10-24

## 2015-10-18 NOTE — Progress Notes (Signed)
Subjective:      Patient ID: Deborah Crosby is a 60 y.o. female.  Chief Complaint   Patient presents with   ??? Fall     Incident occurred on 10-15-15 at home. She was taking her puppy outside when she accidentaly dropped his leash. she went to run after him and fell. Landed on grass. Pont of impact was knees and abdomen. Pain present in both knees - worse in right and right hip. Has tried Flexeril and Voltaren with mild relief       HPI    She is seen today for a fall she landed in between her puppy in his leash onto her knees by Dr. right hip at that she states it came more tender groin after taking Voltaren and Flexeril therefore came in to get checked    No pain down her legs no numbness no tingling otherwise noted per  her      No Known Allergies  Outpatient Encounter Prescriptions as of 10/18/2015   Medication Sig Dispense Refill   ??? methylPREDNISolone (MEDROL DOSEPACK) 4 MG tablet Take by mouth. 1 kit 0   ??? lansoprazole (PREVACID) 30 MG delayed release capsule TAKE ONE CAPSULE BY MOUTH ONCE DAILY 30 capsule 3   ??? PREMARIN 0.3 MG tablet TAKE ONE TABLET BY MOUTH ONCE DAILY 30 tablet 3   ??? atenolol-chlorthalidone (TENORETIC) 50-25 MG per tablet TAKE ONE TABLET BY MOUTH ONCE DAILY 90 tablet 2   ??? atenolol (TENORMIN) 50 MG tablet Take 1 tablet by mouth daily 90 tablet 3   ??? cyclobenzaprine (FLEXERIL) 10 MG tablet Take 1 tablet by mouth 3 times daily as needed for Muscle spasms 180 tablet 3   ??? diclofenac (VOLTAREN) 75 MG EC tablet Take 1 tablet by mouth 2 times daily 180 tablet 3   ??? SUMAtriptan (IMITREX) 100 MG tablet Take 1 tablet by mouth as needed for Migraine 27 tablet 3   ??? Naproxen Sodium (ALEVE PO) Take by mouth     ??? Multiple Vitamin (MULTIVITAMIN PO) Take  by mouth.       ??? acetaminophen-codeine (TYLENOL/CODEINE #3) 300-30 MG per tablet Take 1-2 tablets by mouth every 6 hours as needed for Pain 40 tablet 0   ??? aspirin EC 81 MG EC tablet Take 1 tablet by mouth daily 30 tablet 3     No facility-administered  encounter medications on file as of 10/18/2015.      Social History     Social History   ??? Marital status: Married     Spouse name: Mireyah Chervenak   ??? Number of children: 2   ??? Years of education: N/A     Occupational History   ???  [employer]     Social History Main Topics   ??? Smoking status: Never Smoker   ??? Smokeless tobacco: Never Used   ??? Alcohol use No   ??? Drug use: No   ??? Sexual activity: Not on file     Other Topics Concern   ??? Not on file     Social History Narrative     Family History   Problem Relation Age of Onset   ??? Cancer Mother      breast   ??? Heart Disease Father    ??? Cancer Father      colon     Past Medical History:   Diagnosis Date   ??? Family history of premature CAD 02/07/2014   ??? First  degree AV block 02/07/2014   ??? GERD (gastroesophageal reflux disease)    ??? Gout    ??? Hypertension    ??? Migraine headache    ??? Migraine headache    ??? Precordial pain 02/07/2014     Past Surgical History:   Procedure Laterality Date   ??? APPENDECTOMY     ??? COLONOSCOPY  09/21/2009   ??? COLONOSCOPY  05/30/13    DR LISI   ??? HYSTERECTOMY      partial   ??? TONSILLECTOMY     ??? UPPER GASTROINTESTINAL ENDOSCOPY  09/21/2009         REVIEW OF SYSTEMS:   REVIEW OF SYSTEMS:   Patient seen today for exam.  Denies any problems with hearing, headaches or vision.  Denies any shortness of breath, chest pain, nausea or vomiting.  No black stool, no blood in the stool.  No heartburn.  Denies any problems with constipation or diarrhea either.  No dysuria type symptoms.        Objective:     BP 124/84 (Site: Left Arm, Position: Sitting, Cuff Size: Medium Adult)   Pulse 74   Temp 96.9 ??F (36.1 ??C) (Temporal)    Ht '5\' 2"'  (1.575 m)   Wt 174 lb 2 oz (79 kg)   LMP 05/29/2003   SpO2 95%   BMI 31.85 kg/m2    Physical Exam      O:  Alert and active female in no acute distress  HEENT:  TMs clear. Pharynx neg. Nares clear, no drainage noted  Neck supple/ no adenopathy   HEART:  RRR without murmur/ no carotid bruits  LUNGS:  Clear to  auscultation bilaterally, no wheeze or rhonchi noted  THYROID: neg masses or nodularity  ABDOMEN:  Soft x4.  Bowel sounds positive. No masses or organomegaly,  Negative tenderness, guarding or rebound.    EXTR:  Without edema./ good pulses bilat    Neurologic exam unremarkable.  DTRs in upper and lower extremities within normal limits.  Full strength noted    Skin- no lesions noted   Right hip neurovascular intact positive pain with internal rotation  Knees positive tenderness of the patellar tendons otherwise unremarkable  Assessment:      1. Right hip pain  XR Hip Right Standard   2. Contusion of knee, unspecified laterality, initial encounter               Plan:        Orders Placed This Encounter   Medications   ??? methylPREDNISolone (MEDROL DOSEPACK) 4 MG tablet     Sig: Take by mouth.     Dispense:  1 kit     Refill:  0     Orders Placed This Encounter   Procedures   ??? XR Hip Right Standard     Standing Status:   Future     Number of Occurrences:   1     Standing Expiration Date:   10/17/2016     X-rays are negative she'll continue using ice heat alternating along with the steroids plus no things worsen or change      Health Maintenance Due   Topic Date Due   ??? Flu vaccine (1) 10/09/2015             Controlled Substances Monitoring:          If anything worsens or changes please call us at once , follow-up in the office as planned,

## 2015-11-29 MED ORDER — METHYLPREDNISOLONE 4 MG PO TBPK
4 MG | PACK | ORAL | 0 refills | Status: DC
Start: 2015-11-29 — End: 2016-03-18

## 2015-11-29 NOTE — Telephone Encounter (Signed)
Per [employer] message        Can you please send a prescription to [facility] for either another regimen of Prednisone tapered doses or Medrol dose pak? ?? The pain in my right hip & knee has greatly increased again. ??Thank you!       Please advise

## 2015-11-29 NOTE — Telephone Encounter (Signed)
E prescribed med to their pharmacy.  Call pt. And inform.

## 2015-11-29 NOTE — Telephone Encounter (Signed)
mychart message sent

## 2016-02-04 ENCOUNTER — Encounter

## 2016-02-04 NOTE — Telephone Encounter (Signed)
Last seen 10-18-15

## 2016-02-05 MED ORDER — LANSOPRAZOLE 30 MG PO CPDR
30 MG | ORAL_CAPSULE | ORAL | 1 refills | Status: DC
Start: 2016-02-05 — End: 2016-08-06

## 2016-03-08 ENCOUNTER — Encounter

## 2016-03-11 MED ORDER — ATENOLOL-CHLORTHALIDONE 50-25 MG PO TABS
50-25 MG | ORAL_TABLET | ORAL | 1 refills | Status: DC
Start: 2016-03-11 — End: 2016-08-29

## 2016-03-11 NOTE — Telephone Encounter (Signed)
Pt last seen 10-18-15

## 2016-03-18 ENCOUNTER — Encounter

## 2016-03-18 ENCOUNTER — Ambulatory Visit
Admit: 2016-03-18 | Discharge: 2016-03-18 | Payer: PRIVATE HEALTH INSURANCE | Attending: Family Medicine | Primary: Family Medicine

## 2016-03-18 DIAGNOSIS — M545 Low back pain: Secondary | ICD-10-CM

## 2016-03-18 MED ORDER — ESTROGENS CONJUGATED 0.3 MG PO TABS
0.3 | ORAL_TABLET | ORAL | 5 refills | Status: DC
Start: 2016-03-18 — End: 2016-08-29

## 2016-03-18 MED ORDER — PREDNISONE 10 MG PO TABS
10 MG | ORAL_TABLET | ORAL | 0 refills | Status: AC
Start: 2016-03-18 — End: 2016-03-28

## 2016-03-18 MED ORDER — SULFAMETHOXAZOLE-TRIMETHOPRIM 800-160 MG PO TABS
800-160 MG | ORAL_TABLET | Freq: Two times a day (BID) | ORAL | 0 refills | Status: AC
Start: 2016-03-18 — End: 2016-03-28

## 2016-03-18 NOTE — Progress Notes (Signed)
Subjective  Deborah Crosby, 61 y.o. female presents today with:  Chief Complaint   Patient presents with   ??? Back Pain     x 1 day this does gets worse with movement. pain is moderate states that pain starts at the right side of the neck and radiates down, has tried OTC aleve with no relief.    ??? Joint Swelling     left middle finger swelling x 1 wk, this is moderate, not able to bend finger and is warm touch.    ??? Lesion(s)     x 1 day located on top of head does have redness and mild swelling.            HPI    Patient with complaint of middle finger joint pain a painful lump at the top of her scalp and neck pain.    No other questions and or concerns for today's visit      Review of Systems   Constitutional: Negative for activity change, appetite change, fatigue and fever.   HENT: Negative for ear pain.    Eyes: Negative.    Respiratory: Negative for cough and shortness of breath.    Cardiovascular: Negative for chest pain and palpitations.   Gastrointestinal: Negative for abdominal pain, constipation, diarrhea and nausea.   Genitourinary: Negative for dysuria and frequency.   Neurological: Negative for dizziness and headaches.         Past Medical History:   Diagnosis Date   ??? Family history of premature CAD 02/07/2014   ??? First degree AV block 02/07/2014   ??? GERD (gastroesophageal reflux disease)    ??? Gout    ??? Hypertension    ??? Migraine headache    ??? Migraine headache    ??? Precordial pain 02/07/2014     Past Surgical History:   Procedure Laterality Date   ??? APPENDECTOMY     ??? COLONOSCOPY  09/21/2009   ??? COLONOSCOPY  05/30/13    DR LISI   ??? HYSTERECTOMY      partial   ??? TONSILLECTOMY     ??? UPPER GASTROINTESTINAL ENDOSCOPY  09/21/2009     Social History     Social History   ??? Marital status: Married     Spouse name: Aleaya Latona   ??? Number of children: 2   ??? Years of education: N/A     Occupational History   ???  [employer]     Social History Main Topics   ??? Smoking status: Never Smoker   ???  Smokeless tobacco: Never Used   ??? Alcohol use No   ??? Drug use: No   ??? Sexual activity: Not on file     Other Topics Concern   ??? Not on file     Social History Narrative   ??? No narrative on file     Family History   Problem Relation Age of Onset   ??? Cancer Mother      breast   ??? Heart Disease Father    ??? Cancer Father      colon     No Known Allergies  Current Outpatient Prescriptions   Medication Sig Dispense Refill   ??? PHYSICIANS EZ USE JOINT/TUNNEL 40-1 MG/ML-% KIT      ??? atenolol-chlorthalidone (TENORETIC) 50-25 MG per tablet TAKE ONE TABLET BY MOUTH ONCE DAILY 90 tablet 1   ??? lansoprazole (PREVACID) 30 MG delayed release capsule TAKE ONE CAPSULE BY MOUTH ONCE  DAILY 90 capsule 1   ??? PREMARIN 0.3 MG tablet TAKE ONE TABLET BY MOUTH ONCE DAILY 30 tablet 3   ??? atenolol (TENORMIN) 50 MG tablet Take 1 tablet by mouth daily 90 tablet 3   ??? cyclobenzaprine (FLEXERIL) 10 MG tablet Take 1 tablet by mouth 3 times daily as needed for Muscle spasms 180 tablet 3   ??? diclofenac (VOLTAREN) 75 MG EC tablet Take 1 tablet by mouth 2 times daily 180 tablet 3   ??? SUMAtriptan (IMITREX) 100 MG tablet Take 1 tablet by mouth as needed for Migraine 27 tablet 3   ??? acetaminophen-codeine (TYLENOL/CODEINE #3) 300-30 MG per tablet Take 1-2 tablets by mouth every 6 hours as needed for Pain 40 tablet 0   ??? Naproxen Sodium (ALEVE PO) Take by mouth     ??? aspirin EC 81 MG EC tablet Take 1 tablet by mouth daily 30 tablet 3   ??? Multiple Vitamin (MULTIVITAMIN PO) Take  by mouth.         No current facility-administered medications for this visit.      PMH, Surgical Hx, Family Hx, and Social Hx reviewed and updated.  Health Maintenance reviewed.    Objective    Vitals:    03/18/16 1520   BP: 116/78   Pulse: 66   Resp: 20   Temp: 96.6 ??F (35.9 ??C)   TempSrc: Temporal   Weight: 179 lb 3.2 oz (81.3 kg)   Height: 5' 2.5" (1.588 m)       Physical Exam   Constitutional: She is oriented to person, place, and time. She appears well-developed and  well-nourished.   HENT:   Head: Normocephalic.   Mouth/Throat: Oropharynx is clear and moist.   Eyes: Pupils are equal, round, and reactive to light.   Neck: Normal range of motion. Neck supple. No thyromegaly present.   Cardiovascular: Normal rate and intact distal pulses.  Exam reveals no gallop and no friction rub.    No murmur heard.  Pulmonary/Chest: Effort normal and breath sounds normal.   Abdominal: Soft. Bowel sounds are normal.   Musculoskeletal: Normal range of motion.   Neurological: She is alert and oriented to person, place, and time.   Skin: Skin is warm and dry.   Psychiatric: She has a normal mood and affect. Her behavior is normal.     He has mild cervical strain and some mild decreased range of motion flexing neck no history of trauma that she has a small superficial abscess.  His scalp this was unroofed purulent material was expressed and sent for culture.  No trauma that she is aware of she has mild swelling of the PIP joint a left middle finger consistent with exacerbation of osteoarthritis.  Plan is Medrol Dosepak antibiotic follow-up in one week sooner if worsening  Assessment & Plan   1. Low back pain, unspecified back pain laterality, unspecified chronicity, with sciatica presence unspecified     2. Finger swelling     3. Bump     4. Abscess  Anaerobic And Aerobic Culture   5. Neck pain     6. Muscle spasm       Orders Placed This Encounter   Procedures   ??? Anaerobic And Aerobic Culture     Standing Status:   Future     Number of Occurrences:   1     Standing Expiration Date:   03/18/2017     Orders Placed This Encounter   Medications   ???  estrogens, conjugated, (PREMARIN) 0.3 MG tablet     Sig: TAKE ONE TABLET BY MOUTH ONCE DAILY     Dispense:  30 tablet     Refill:  5   ??? sulfamethoxazole-trimethoprim (BACTRIM DS) 800-160 MG per tablet     Sig: Take 1 tablet by mouth 2 times daily for 10 days     Dispense:  20 tablet     Refill:  0   ??? predniSONE (DELTASONE) 10 MG tablet     Sig: Take 6 tabs  x 6 days, then 5 x 1, 4 x 1, 3 x 1, 2 x 1, 1 x 1     Dispense:  51 tablet     Refill:  0     Medications Discontinued During This Encounter   Medication Reason   ??? methylPREDNISolone (MEDROL DOSEPACK) 4 MG tablet Therapy completed   ??? PREMARIN 0.3 MG tablet Reorder     No Follow-up on file.        Controlled Substances Monitoring:          Steward Ros, MD

## 2016-03-21 LAB — CULTURE, ANAEROBIC AND AEROBIC

## 2016-04-03 ENCOUNTER — Ambulatory Visit
Admit: 2016-04-03 | Discharge: 2016-04-03 | Payer: PRIVATE HEALTH INSURANCE | Attending: Family | Primary: Family Medicine

## 2016-04-03 DIAGNOSIS — J019 Acute sinusitis, unspecified: Secondary | ICD-10-CM

## 2016-04-03 DIAGNOSIS — B9689 Other specified bacterial agents as the cause of diseases classified elsewhere: Secondary | ICD-10-CM

## 2016-04-03 MED ORDER — PREDNISONE 20 MG PO TABS
20 MG | ORAL_TABLET | Freq: Two times a day (BID) | ORAL | 0 refills | Status: AC
Start: 2016-04-03 — End: 2016-04-08

## 2016-04-03 MED ORDER — BENZONATATE 100 MG PO CAPS
100 MG | ORAL_CAPSULE | ORAL | 0 refills | Status: DC
Start: 2016-04-03 — End: 2016-04-14

## 2016-04-03 MED ORDER — LEVOCETIRIZINE DIHYDROCHLORIDE 5 MG PO TABS
5 MG | ORAL_TABLET | Freq: Every evening | ORAL | 0 refills | Status: DC
Start: 2016-04-03 — End: 2016-04-14

## 2016-04-03 MED ORDER — AMOXICILLIN-POT CLAVULANATE 875-125 MG PO TABS
875-125 MG | ORAL_TABLET | Freq: Two times a day (BID) | ORAL | 0 refills | Status: AC
Start: 2016-04-03 — End: 2016-04-10

## 2016-04-07 ENCOUNTER — Telehealth

## 2016-04-07 MED ORDER — LEVOFLOXACIN 500 MG PO TABS
500 MG | ORAL_TABLET | Freq: Every day | ORAL | 0 refills | Status: AC
Start: 2016-04-07 — End: 2016-04-12

## 2016-04-07 NOTE — Telephone Encounter (Signed)
Pt aware

## 2016-04-07 NOTE — Telephone Encounter (Signed)
PATIENT CALLED STATING SHE SEEN NIKI SMILEY WILSON ON 04/03/16 FOR SINUSITIS. PATIENT STATES SHE IS NOT FEELING ANY BETTER AND IS WANTING TO KNOW IF A STRONGER ATB CAN BE SENT TO HER PHARMACY? PLEASE ADVISE

## 2016-04-07 NOTE — Progress Notes (Signed)
Subjective  Deborah HeysBarbara M Crosby, 61 y.o. female presents today with:  Chief Complaint   Patient presents with   ??? URI     Cough, chest congestion, burning in chest, headache, and sore throat, fatigue too x1wk and getting worst        URI    This is a new problem. The current episode started in the past 7 days. The problem has been gradually worsening. The maximum temperature recorded prior to her arrival was 100.4 - 100.9 F. Associated symptoms include congestion, coughing (burning in chest when coughs), headaches (and fatigue), a plugged ear sensation, rhinorrhea, sinus pain and a sore throat. Pertinent negatives include no abdominal pain, chest pain, diarrhea, dysuria, ear pain, joint pain, joint swelling, nausea, neck pain, rash, sneezing, swollen glands, vomiting or wheezing. She has tried nothing for the symptoms.         Objective    Vitals:    04/03/16 1541   BP: 110/70   Pulse: 64   Resp: 16   Temp: 97.9 ??F (36.6 ??C)   TempSrc: Temporal   SpO2: 95%   Weight: 174 lb 3.2 oz (79 kg)   Height: 5' 2.5" (1.588 m)       Physical Exam   Constitutional: She is oriented to person, place, and time. She appears well-developed and well-nourished.   HENT:   Head: Normocephalic and atraumatic.   Right Ear: Hearing, external ear and ear canal normal. Tympanic membrane is injected and bulging. A middle ear effusion is present.   Left Ear: Hearing, external ear and ear canal normal. Tympanic membrane is injected and bulging. A middle ear effusion is present.   Nose: Mucosal edema and rhinorrhea present. Right sinus exhibits maxillary sinus tenderness and frontal sinus tenderness. Left sinus exhibits maxillary sinus tenderness and frontal sinus tenderness.   Mouth/Throat: Uvula is midline and mucous membranes are normal. Posterior oropharyngeal erythema (post nasal drip) present.   Eyes: Conjunctivae and EOM are normal. Pupils are equal, round, and reactive to light.   Neck: Normal range of motion.   Cardiovascular: Normal rate,  regular rhythm, normal heart sounds and intact distal pulses.    Pulmonary/Chest: Effort normal and breath sounds normal.   Harsh barky cough   Abdominal: Soft.   Musculoskeletal: Normal range of motion. She exhibits no edema.   Lymphadenopathy:     She has cervical adenopathy.   Neurological: She is alert and oriented to person, place, and time.   Skin: Skin is warm and dry. No rash noted.       Assessment & Plan   1. Acute bacterial sinusitis  predniSONE (DELTASONE) 20 MG tablet    amoxicillin-clavulanate (AUGMENTIN) 875-125 MG per tablet    levocetirizine (XYZAL) 5 MG tablet    Symptomatic, RXs given    2. Bilateral non-suppurative otitis media  predniSONE (DELTASONE) 20 MG tablet    amoxicillin-clavulanate (AUGMENTIN) 875-125 MG per tablet    levocetirizine (XYZAL) 5 MG tablet   3. Bronchitis  benzonatate (TESSALON) 100 MG capsule    predniSONE (DELTASONE) 20 MG tablet       Return if symptoms worsen or fail to improve.    Reviewed with the patient: current clinical status, medications, activities and diet.     Side effects, adverse effects of the medication prescribed today, as well as treatment plan/ rationale and result expectations have been discussed with the patient who expresses understanding and desires to proceed.    Close follow up to evaluate treatment results and  for coordination of care.  I have reviewed the patient's medical history in detail and updated the computerized patient record.    Greggory BrandyNiki V Smiley-Wilson, NP

## 2016-04-07 NOTE — Telephone Encounter (Signed)
done

## 2016-04-08 MED ORDER — FLUCONAZOLE 100 MG PO TABS
100 MG | ORAL_TABLET | Freq: Every day | ORAL | 0 refills | Status: AC
Start: 2016-04-08 — End: 2016-04-13

## 2016-04-08 NOTE — Telephone Encounter (Signed)
LMOM that requested medication sent.

## 2016-04-08 NOTE — Telephone Encounter (Signed)
Done

## 2016-04-08 NOTE — Telephone Encounter (Signed)
PATIENT CALLED STATING SHE IS ON HER SECOND ROUND OF ATB AND IS REQUESTING DIFLUCAN FOR YEAST INFECTION. C/O IRRIGTATION AND ITCH. PLEASE ADVISE    LAST SEEN 04/03/16

## 2016-04-11 ENCOUNTER — Inpatient Hospital Stay: Admit: 2016-04-11 | Payer: PRIVATE HEALTH INSURANCE | Primary: Family Medicine

## 2016-04-11 ENCOUNTER — Ambulatory Visit
Admit: 2016-04-11 | Discharge: 2016-04-11 | Payer: PRIVATE HEALTH INSURANCE | Attending: Family | Primary: Family Medicine

## 2016-04-11 DIAGNOSIS — R6889 Other general symptoms and signs: Secondary | ICD-10-CM

## 2016-04-11 DIAGNOSIS — J40 Bronchitis, not specified as acute or chronic: Secondary | ICD-10-CM

## 2016-04-11 LAB — POCT INFLUENZA A/B
Influenza A Ab: NEGATIVE
Influenza B Ab: NEGATIVE

## 2016-04-11 MED ORDER — ALBUTEROL SULFATE HFA 108 (90 BASE) MCG/ACT IN AERS
108 (90 Base) MCG/ACT | RESPIRATORY_TRACT | 0 refills | Status: AC | PRN
Start: 2016-04-11 — End: ?

## 2016-04-11 MED ORDER — PREDNISONE 10 MG PO TABS
10 MG | ORAL_TABLET | ORAL | 0 refills | Status: DC
Start: 2016-04-11 — End: 2016-04-14

## 2016-04-11 MED ORDER — FLUTICASONE PROPIONATE 50 MCG/ACT NA SUSP
50 MCG/ACT | Freq: Every day | NASAL | 0 refills | Status: DC
Start: 2016-04-11 — End: 2017-04-23

## 2016-04-11 NOTE — Progress Notes (Signed)
Please call patient with results

## 2016-04-14 ENCOUNTER — Ambulatory Visit
Admit: 2016-04-14 | Discharge: 2016-04-14 | Payer: PRIVATE HEALTH INSURANCE | Attending: Family | Primary: Family Medicine

## 2016-04-14 DIAGNOSIS — J40 Bronchitis, not specified as acute or chronic: Secondary | ICD-10-CM

## 2016-04-14 MED ORDER — CLINDAMYCIN HCL 300 MG PO CAPS
300 MG | ORAL_CAPSULE | Freq: Four times a day (QID) | ORAL | 0 refills | Status: AC
Start: 2016-04-14 — End: 2016-04-21

## 2016-04-14 MED ORDER — BENZONATATE 100 MG PO CAPS
100 | ORAL_CAPSULE | ORAL | 0 refills | Status: DC
Start: 2016-04-14 — End: 2016-05-19

## 2016-04-14 MED ORDER — LEVOCETIRIZINE DIHYDROCHLORIDE 5 MG PO TABS
5 | ORAL_TABLET | Freq: Every evening | ORAL | 0 refills | Status: DC
Start: 2016-04-14 — End: 2016-08-05

## 2016-04-14 NOTE — Progress Notes (Signed)
Subjective  Deborah HeysBarbara M Crosby, 61 y.o. female presents today with:  Chief Complaint   Patient presents with   ??? Breathing Problem     coughing, congestion and still some SOB       HPI  Patient has returned today for a recheck on her breathing. The patient has been suffering from congestion, post nasal drip, and cough for some time.  The patient a few days ago was seen for the same complaints and started on steroids and an inhaler. A CXR was done at that time.  The XR was normal save for possible scarring or atelectasis on the left. The patient states that the wheezing and crackly feeling has subsided, but she continues to cough and is unable to lie down at night because of it.  The patient states that overall she feels much improved, but the cough has continued and remains unchanged.     Objective    Vitals:    04/14/16 1600   BP: 120/80   Pulse: 72   Resp: 16   Temp: 97.9 ??F (36.6 ??C)   TempSrc: Temporal   SpO2: 95%   Weight: 179 lb (81.2 kg)   Height: 5' 2.5" (1.588 m)       Physical Exam   Constitutional: She is oriented to person, place, and time. She appears well-developed and well-nourished.   HENT:   Head: Normocephalic and atraumatic.   Right Ear: Hearing, external ear and ear canal normal. Tympanic membrane is erythematous and bulging. A middle ear effusion is present.   Left Ear: Hearing, external ear and ear canal normal. A middle ear effusion is present.   Nose: Nose normal. Right sinus exhibits no maxillary sinus tenderness and no frontal sinus tenderness. Left sinus exhibits no maxillary sinus tenderness and no frontal sinus tenderness.   Mouth/Throat: Uvula is midline and mucous membranes are normal. Posterior oropharyngeal erythema (post nasal drip-heavy with cobblestone appearance) present. No posterior oropharyngeal edema.   Eyes: EOM are normal.   Neck: Normal range of motion.   Cardiovascular: Normal rate, regular rhythm and normal heart sounds.    Pulmonary/Chest: Effort normal and breath sounds  normal.   Patient does have frequent, dry, harsh cough during exam and interview   Abdominal: Soft. Bowel sounds are normal.   Musculoskeletal: Normal range of motion.   Neurological: She is alert and oriented to person, place, and time.   Skin: Skin is warm and dry.   Psychiatric: She has a normal mood and affect.       Assessment & Plan   1. Bronchitis  benzonatate (TESSALON) 100 MG capsule    Continues to be symptomatic, but has slight improvement. Pt to continue current treatment. ATB rx for ear infection.    2. Other acute nonsuppurative otitis media of right ear, recurrence not specified  clindamycin (CLEOCIN) 300 MG capsule   3. Recurrent acute serous otitis media of left ear  levocetirizine (XYZAL) 5 MG tablet       Return in about 1 week (around 04/21/2016) for breathing re-check if not improved.    Reviewed with the patient: current clinical status, medications, activities and diet.     Side effects, adverse effects of the medication prescribed today, as well as treatment plan/ rationale and result expectations have been discussed with the patient who expresses understanding and desires to proceed.    Close follow up to evaluate treatment results and for coordination of care.  I have reviewed the patient's medical history in detail  and updated the computerized patient record.    Crawford Givens, NP

## 2016-04-17 NOTE — Progress Notes (Signed)
Subjective  Deborah HeysBarbara M Crosby, 61 y.o. female presents today with:  Chief Complaint   Patient presents with   ??? Generalized Body Aches     x today   ??? Cough       HPI    Patient presents today after having continuing symptoms of a sinus infection that has lasted since her last visit.  The patient states that the congestion and head pressure has improved but she continues to cough and gag.  The patient states that she can hear herself wheezing.  She states that she developed body aches over the last 24 hours.  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  The patient was prescribed 2 different antibiotics, a medrol dose pack, an antihistamine and cough medication.  The patient states that she thinks she might be slightly improved, but not much.        Objective    Vitals:    04/11/16 1542   BP: 112/68   Pulse: 71   Resp: 16   Temp: 98.5 ??F (36.9 ??C)   TempSrc: Temporal   SpO2: 97%   Weight: 174 lb (78.9 kg)   Height: 5' 2.5" (1.588 m)       Physical Exam   Constitutional: She is oriented to person, place, and time. She appears well-developed and well-nourished.   HENT:   Head: Normocephalic and atraumatic.   Right Ear: Hearing, external ear and ear canal normal. A middle ear effusion is present.   Left Ear: Hearing, external ear and ear canal normal. Tympanic membrane is injected (faint). A middle ear effusion is present.   Nose: Nose normal.   Mouth/Throat: Uvula is midline and mucous membranes are normal. Posterior oropharyngeal erythema (heavy post nasal drip and marked  cobble stone appearance) present.   Eyes: EOM are normal.   Neck: Normal range of motion.   Cardiovascular: Normal rate, regular rhythm and normal heart sounds.    Pulmonary/Chest: Effort normal. She has no decreased breath sounds. She has no wheezes. She has no rhonchi. She has rales in the right lower field and the left lower field.   Harsh barky dry frequent cough   Abdominal: Soft. Bowel sounds are normal.    Musculoskeletal: Normal range of motion.   Neurological: She is alert and oriented to person, place, and time.   Skin: Skin is warm and dry.   Psychiatric: She has a normal mood and affect.       Assessment & Plan   1. Bronchitis  albuterol sulfate HFA 108 (90 Base) MCG/ACT inhaler    XR CHEST STANDARD (2 VW)    Symptomatic, RXs given, steroids, inhaler and nasal spray given. CXR to r/o infiltrate.    2. Bibasilar crackles  albuterol sulfate HFA 108 (90 Base) MCG/ACT inhaler    XR CHEST STANDARD (2 VW)   3. Post-nasal drip  XR CHEST STANDARD (2 VW)    fluticasone (FLONASE) 50 MCG/ACT nasal spray   4. Cough  XR CHEST STANDARD (2 VW)   5. Flu-like symptoms  POCT Influenza A/B    XR CHEST STANDARD (2 VW)    rapid flu negative       Return in about 3 days (around 04/14/2016) for breathing recheck .    Reviewed with the patient: current clinical status, medications, activities and diet.     Side effects, adverse effects of the medication prescribed today, as well as treatment plan/ rationale and result expectations have  been discussed with the patient who expresses understanding and desires to proceed.    Close follow up to evaluate treatment results and for coordination of care.  I have reviewed the patient's medical history in detail and updated the computerized patient record.    Greggory Brandy, NP

## 2016-05-05 MED ORDER — CYCLOBENZAPRINE HCL 10 MG PO TABS
10 MG | ORAL_TABLET | ORAL | 1 refills | Status: DC
Start: 2016-05-05 — End: 2016-08-29

## 2016-05-05 MED ORDER — SUMATRIPTAN SUCCINATE 100 MG PO TABS
100 MG | ORAL_TABLET | ORAL | 1 refills | Status: DC
Start: 2016-05-05 — End: 2017-01-15

## 2016-05-05 NOTE — Telephone Encounter (Signed)
Last seen 03/2016

## 2016-05-19 ENCOUNTER — Ambulatory Visit
Admit: 2016-05-19 | Discharge: 2016-05-19 | Payer: PRIVATE HEALTH INSURANCE | Attending: Family Medicine | Primary: Family Medicine

## 2016-05-19 DIAGNOSIS — M25512 Pain in left shoulder: Secondary | ICD-10-CM

## 2016-05-19 MED ORDER — PREDNISONE 10 MG PO TABS
10 MG | ORAL_TABLET | ORAL | 0 refills | Status: AC
Start: 2016-05-19 — End: 2016-05-29

## 2016-05-19 NOTE — Progress Notes (Signed)
Subjective  Deborah Crosby, 61 y.o. female presents today with:  Chief Complaint   Patient presents with   ??? Shoulder Pain     lt shoulder pain x 1 wk. No injury she can recall. Pain has been worsening since the onset. She has been taking Ibuprofen- minimal relief           HPI    Patient here today with complaints of bilateral shoulder pain started a week or so ago on no trauma.    No other questions and or concerns for today's visit      Review of Systems   Constitutional: Negative for appetite change, fatigue and fever.   HENT: Negative for congestion, ear pain, sinus pressure and sore throat.    Eyes: Negative for discharge and itching.   Respiratory: Negative for cough and shortness of breath.    Cardiovascular: Negative for chest pain and palpitations.   Gastrointestinal: Negative for abdominal pain, constipation and diarrhea.   Endocrine: Negative for polydipsia.   Genitourinary: Negative for difficulty urinating.   Musculoskeletal: Positive for arthralgias and myalgias. Negative for gait problem.   Skin: Negative.    Neurological: Negative for dizziness.   Hematological: Negative.    Psychiatric/Behavioral: Negative.          Past Medical History:   Diagnosis Date   ??? Family history of premature CAD 02/07/2014   ??? First degree AV block 02/07/2014   ??? GERD (gastroesophageal reflux disease)    ??? Gout    ??? Hypertension    ??? Migraine headache    ??? Migraine headache    ??? Precordial pain 02/07/2014     Past Surgical History:   Procedure Laterality Date   ??? APPENDECTOMY     ??? COLONOSCOPY  09/21/2009   ??? COLONOSCOPY  05/30/13    DR LISI   ??? HYSTERECTOMY      partial   ??? TONSILLECTOMY     ??? UPPER GASTROINTESTINAL ENDOSCOPY  09/21/2009     Social History     Social History   ??? Marital status: Married     Spouse name: Ieesha Abbasi   ??? Number of children: 2   ??? Years of education: N/A     Occupational History   ???  [employer]     Social History Main Topics   ??? Smoking status: Never Smoker   ???  Smokeless tobacco: Never Used   ??? Alcohol use No   ??? Drug use: No   ??? Sexual activity: Not on file     Other Topics Concern   ??? Not on file     Social History Narrative   ??? No narrative on file     Family History   Problem Relation Age of Onset   ??? Cancer Mother      breast   ??? Heart Disease Father    ??? Cancer Father      colon     No Known Allergies  Current Outpatient Prescriptions   Medication Sig Dispense Refill   ??? cyclobenzaprine (FLEXERIL) 10 MG tablet TAKE ONE TABLET BY MOUTH THREE TIMES DAILY AS NEEDED FOR MUSCLE SPASMS. 180 tablet 1   ??? SUMAtriptan (IMITREX) 100 MG tablet TAKE ONE TABLET BY MOUTH AS NEEDED FOR  MIGRAINE 27 tablet 1   ??? levocetirizine (XYZAL) 5 MG tablet Take 1 tablet by mouth nightly 30 tablet 0   ??? albuterol sulfate HFA 108 (90 Base) MCG/ACT inhaler Inhale 2  puffs into the lungs every 4 hours as needed for Wheezing or Shortness of Breath 1 Inhaler 0   ??? fluticasone (FLONASE) 50 MCG/ACT nasal spray 2 sprays by Nasal route daily 1 Bottle 0   ??? PHYSICIANS EZ USE JOINT/TUNNEL 40-1 MG/ML-% KIT      ??? estrogens, conjugated, (PREMARIN) 0.3 MG tablet TAKE ONE TABLET BY MOUTH ONCE DAILY 30 tablet 5   ??? atenolol-chlorthalidone (TENORETIC) 50-25 MG per tablet TAKE ONE TABLET BY MOUTH ONCE DAILY 90 tablet 1   ??? lansoprazole (PREVACID) 30 MG delayed release capsule TAKE ONE CAPSULE BY MOUTH ONCE DAILY 90 capsule 1   ??? diclofenac (VOLTAREN) 75 MG EC tablet Take 1 tablet by mouth 2 times daily 180 tablet 3   ??? acetaminophen-codeine (TYLENOL/CODEINE #3) 300-30 MG per tablet Take 1-2 tablets by mouth every 6 hours as needed for Pain 40 tablet 0   ??? Naproxen Sodium (ALEVE PO) Take by mouth     ??? aspirin EC 81 MG EC tablet Take 1 tablet by mouth daily 30 tablet 3   ??? Multiple Vitamin (MULTIVITAMIN PO) Take  by mouth.         No current facility-administered medications for this visit.      PMH, Surgical Hx, Family Hx, and Social Hx reviewed and updated.  Health Maintenance reviewed.    Objective    Vitals:     05/19/16 1532   BP: 108/68   Pulse: 69   Resp: 20   Temp: 97 ??F (36.1 ??C)   TempSrc: Temporal   Weight: 179 lb (81.2 kg)   Height: 5' 2.5" (1.588 m)       Physical Exam   Constitutional: She is oriented to person, place, and time. She appears well-developed and well-nourished.   HENT:   Head: Normocephalic.   Mouth/Throat: Oropharynx is clear and moist.   Eyes: Pupils are equal, round, and reactive to light.   Neck: Normal range of motion. Neck supple. No thyromegaly present.   Cardiovascular: Normal rate and intact distal pulses.  Exam reveals no gallop and no friction rub.    No murmur heard.  Pulmonary/Chest: Effort normal and breath sounds normal.   Abdominal: Soft. Bowel sounds are normal.   Musculoskeletal: Normal range of motion.   Neurological: She is alert and oriented to person, place, and time.   Skin: Skin is warm and dry.   Psychiatric: She has a normal mood and affect. Her behavior is normal.     She has discomfort pain in both shoulders worse on the left side.  She said surgery on each shoulder in the past also.  She is right-handed dominant shoulder she has some posterior pain and pain in the biceps tendon.  She is able to rotate and elevate the shoulder with minimal discomfort.  The left shoulder is much more painful she cannot elevate and rotate the shoulder without extreme pain she has severe biceps tendinitis from the side also pain in the trapezius and left sided neck musculature.  Muscle spasm and strain and shoulder pain no trauma.  Plan burst and taper steroids.  Assessment & Plan   1. Left shoulder pain, unspecified chronicity     2. Biceps tendinitis of left upper extremity       No orders of the defined types were placed in this encounter.    Orders Placed This Encounter   Medications   ??? predniSONE (DELTASONE) 10 MG tablet     Sig: Take 6 tabs x 6  days, then 5 x 1, 4 x 1, 3 x 1, 2 x 1, 1 x 1     Dispense:  51 tablet     Refill:  0     Medications Discontinued During This Encounter    Medication Reason   ??? benzonatate (TESSALON) 100 MG capsule Therapy completed     No Follow-up on file.        Controlled Substances Monitoring:                                Steward Ros, MD            Tests recommended annual pap examination. Patient aware [facility] could perform this examination for her. Or, [facility] can refer her to a OBGYN.

## 2016-08-05 ENCOUNTER — Ambulatory Visit
Admit: 2016-08-05 | Discharge: 2016-08-05 | Payer: PRIVATE HEALTH INSURANCE | Attending: Family | Primary: Family Medicine

## 2016-08-05 DIAGNOSIS — R1032 Left lower quadrant pain: Secondary | ICD-10-CM

## 2016-08-06 ENCOUNTER — Encounter

## 2016-08-07 MED ORDER — LANSOPRAZOLE 30 MG PO CPDR
30 MG | ORAL_CAPSULE | ORAL | 1 refills | Status: DC
Start: 2016-08-07 — End: 2017-02-01

## 2016-08-07 NOTE — Telephone Encounter (Signed)
LOV: 3/18

## 2016-08-07 NOTE — Progress Notes (Signed)
Subjective  Deborah Crosby, 61 y.o. female presents today with:  Chief Complaint   Patient presents with   . Diarrhea     x 2 days   . GI Problem       Diarrhea    This is a new problem. Episode onset: 3 days. The problem occurs 5 to 10 times per day. The problem has been unchanged. The stool consistency is described as blood tinged, bloody and mucous. The patient states that diarrhea does not awaken her from sleep. Associated symptoms include abdominal pain, bloating, sweats, vomiting (and nausea) and weight loss. Pertinent negatives include no arthralgias, chills, coughing, fever, headaches, increased  flatus, myalgias or URI. There are no known risk factors. She has tried change of diet for the symptoms. The treatment provided no relief.       Past Medical History:   Diagnosis Date   . Family history of premature CAD 02/07/2014   . First degree AV block 02/07/2014   . GERD (gastroesophageal reflux disease)    . Gout    . Hypertension    . Migraine headache    . Migraine headache    . Precordial pain 02/07/2014       No Known Allergies  Current Outpatient Prescriptions   Medication Sig Dispense Refill   . atenolol (TENORMIN) 50 MG tablet Take 50 mg by mouth daily     . albuterol sulfate HFA 108 (90 Base) MCG/ACT inhaler Inhale 2 puffs into the lungs every 4 hours as needed for Wheezing or Shortness of Breath 1 Inhaler 0   . estrogens, conjugated, (PREMARIN) 0.3 MG tablet TAKE ONE TABLET BY MOUTH ONCE DAILY 30 tablet 5   . atenolol-chlorthalidone (TENORETIC) 50-25 MG per tablet TAKE ONE TABLET BY MOUTH ONCE DAILY 90 tablet 1   . acetaminophen-codeine (TYLENOL/CODEINE #3) 300-30 MG per tablet Take 1-2 tablets by mouth every 6 hours as needed for Pain 40 tablet 0   . aspirin EC 81 MG EC tablet Take 1 tablet by mouth daily 30 tablet 3   . Multiple Vitamin (MULTIVITAMIN PO) Take  by mouth.       . lansoprazole (PREVACID) 30 MG delayed release capsule TAKE ONE CAPSULE BY MOUTH ONCE DAILY 90 capsule 1   . cyclobenzaprine  (FLEXERIL) 10 MG tablet TAKE ONE TABLET BY MOUTH THREE TIMES DAILY AS NEEDED FOR MUSCLE SPASMS. 180 tablet 1   . SUMAtriptan (IMITREX) 100 MG tablet TAKE ONE TABLET BY MOUTH AS NEEDED FOR  MIGRAINE 27 tablet 1   . fluticasone (FLONASE) 50 MCG/ACT nasal spray 2 sprays by Nasal route daily 1 Bottle 0   . PHYSICIANS EZ USE JOINT/TUNNEL 40-1 MG/ML-% KIT      . diclofenac (VOLTAREN) 75 MG EC tablet Take 1 tablet by mouth 2 times daily 180 tablet 3   . Naproxen Sodium (ALEVE PO) Take by mouth       No current facility-administered medications for this visit.        Review of Systems   Constitutional: Positive for diaphoresis, unexpected weight change and weight loss. Negative for chills, fatigue and fever.   Respiratory: Negative for cough, chest tightness, shortness of breath, wheezing and stridor.    Cardiovascular: Negative for chest pain, palpitations and leg swelling.   Gastrointestinal: Positive for abdominal distention, abdominal pain, bloating, blood in stool, diarrhea, nausea and vomiting (and nausea). Negative for anal bleeding, constipation, flatus and rectal pain.   Musculoskeletal: Negative for arthralgias and myalgias.   Skin: Negative  for pallor and rash.   Neurological: Negative for headaches.       PMH, Surgical Hx, Family Hx, and Social Hx reviewed and updated.  Health Maintenance reviewed.    Objective    Vitals:    08/05/16 1112   BP: 112/70   Pulse: 73   Resp: 16   Temp: 97.8 F (36.6 C)   TempSrc: Temporal   SpO2: 97%   Weight: 173 lb (78.5 kg)   Height: 5' 2.5" (1.588 m)       Physical Exam   Constitutional: She is oriented to person, place, and time. She appears well-developed and well-nourished.   HENT:   Head: Normocephalic and atraumatic.   Right Ear: Hearing, external ear and ear canal normal.   Left Ear: Hearing, external ear and ear canal normal.   Mouth/Throat: Uvula is midline and mucous membranes are normal.   Eyes: EOM are normal.   Neck: Normal range of motion.   Cardiovascular: Normal  rate, regular rhythm and normal heart sounds.    Pulmonary/Chest: Effort normal and breath sounds normal.   Abdominal: Soft. She exhibits distension. She exhibits no shifting dullness. Bowel sounds are increased. There is no hepatosplenomegaly. There is tenderness in the left lower quadrant. There is rigidity and guarding. There is no rebound, no CVA tenderness, no tenderness at McBurney's point and negative Murphy's sign. No hernia.   Musculoskeletal: Normal range of motion.   Neurological: She is alert and oriented to person, place, and time.   Skin: Skin is warm and dry.   Psychiatric: She has a normal mood and affect.       Assessment & Plan    Diagnosis Orders   1. Left lower quadrant pain     2. Generalized abdominal rigidity     3. Bloated abdomen     4. Blood in stool, frank       Patient was directed to the emergency for further workup.  The patient is in need of stat blood work and imaging.  The patient has had continuous bloody stools for 3 days.      Return for additional work up to the ER.    Reviewed with the patient: current clinical status, medications, activities and diet.     Side effects, adverse effects of the medication prescribed today, as well as treatment plan/ rationale and result expectations have been discussed with the patient who expresses understanding and desires to proceed.    Close follow up to evaluate treatment results and for coordination of care.  I have reviewed the patient's medical history in detail and updated the computerized patient record.    Crawford Givens, APRN - CNP

## 2016-08-29 ENCOUNTER — Ambulatory Visit
Admit: 2016-08-29 | Discharge: 2016-08-29 | Payer: PRIVATE HEALTH INSURANCE | Attending: Family Medicine | Primary: Family Medicine

## 2016-08-29 DIAGNOSIS — I10 Essential (primary) hypertension: Secondary | ICD-10-CM

## 2016-08-29 MED ORDER — ATENOLOL 50 MG PO TABS
50 | ORAL_TABLET | Freq: Every day | ORAL | 5 refills | Status: DC
Start: 2016-08-29 — End: 2017-04-23

## 2016-08-29 MED ORDER — ATENOLOL-CHLORTHALIDONE 50-25 MG PO TABS
50-25 | ORAL_TABLET | Freq: Every day | ORAL | 5 refills | Status: DC
Start: 2016-08-29 — End: 2017-03-05

## 2016-08-29 MED ORDER — POTASSIUM CHLORIDE CRYS ER 10 MEQ PO TBCR
10 MEQ | ORAL_TABLET | Freq: Every day | ORAL | 3 refills | Status: DC
Start: 2016-08-29 — End: 2017-01-02

## 2016-08-29 MED ORDER — PHENTERMINE HCL 37.5 MG PO TABS
37.5 MG | ORAL_TABLET | Freq: Every day | ORAL | 0 refills | Status: DC
Start: 2016-08-29 — End: 2016-09-26

## 2016-08-29 NOTE — Telephone Encounter (Signed)
Pharmacist is aware.

## 2016-08-29 NOTE — Progress Notes (Signed)
Subjective  Deborah Crosby, 61 y.o. female presents today with:  Chief Complaint   Patient presents with   . Palpitations        . Medication Refill        . Other     pt states they she is past due for labs and would like to get them done at this visit           HPI  pt c/o having palpitations X few days ago she states they've become a bit more frequent and stronger.      pt would like refills on atentenol '50Mg'$  and tenerectic     Taking current medications which were reviewed.  Problem list discussed.  Eating okay.  Staying active    Overall doing ok  Has no other new problem / question.  HAD COLON YESTERDAY - DR LISI    Health Maintenance Is Up-To-Date         No other questions and or concerns for today's visit      Review of Systems   Constitutional: Negative for activity change, appetite change, fatigue and fever.   HENT: Negative for ear pain.    Eyes: Negative.    Respiratory: Negative for cough and shortness of breath.    Cardiovascular: Negative for chest pain and palpitations.   Gastrointestinal: Negative for abdominal pain, constipation, diarrhea and nausea.   Genitourinary: Negative for dysuria and frequency.   Neurological: Negative for dizziness and headaches.         Past Medical History:   Diagnosis Date   . Family history of premature CAD 02/07/2014   . First degree AV block 02/07/2014   . GERD (gastroesophageal reflux disease)    . Gout    . Hypertension    . Migraine headache    . Migraine headache    . Precordial pain 02/07/2014     Past Surgical History:   Procedure Laterality Date   . APPENDECTOMY     . COLONOSCOPY  09/21/2009   . COLONOSCOPY  05/30/13    DR LISI   . COLONOSCOPY  08/28/2016    DR LISI,   5 YRS   . HYSTERECTOMY      partial   . TONSILLECTOMY     . UPPER GASTROINTESTINAL ENDOSCOPY  09/21/2009     Social History     Social History   . Marital status: Married     Spouse name: Vernelle Wisner   . Number of children: 2   . Years of education: N/A     Occupational History   .  [employer]     Social History Main Topics   . Smoking status: Never Smoker   . Smokeless tobacco: Never Used   . Alcohol use No   . Drug use: No   . Sexual activity: Not on file     Other Topics Concern   . Not on file     Social History Narrative   . No narrative on file     Family History   Problem Relation Age of Onset   . Cancer Mother         breast   . Heart Disease Father    . Cancer Father         colon     No Known Allergies  Current Outpatient Prescriptions   Medication Sig Dispense Refill   . cyclobenzaprine (FLEXERIL) 10 MG tablet as necessary     .  dicyclomine (BENTYL) 20 MG tablet Take 20 mg by mouth     . pantoprazole (PROTONIX) 40 MG tablet Take 40 mg by mouth     . estrogens, conjugated, (PREMARIN) 0.3 MG tablet Take 0.3 mg by mouth     . atenolol-chlorthalidone (TENORETIC) 50-25 MG per tablet Take 1 tablet by mouth daily 30 tablet 5   . atenolol (TENORMIN) 50 MG tablet Take 1 tablet by mouth daily 30 tablet 5   . potassium chloride (KLOR-CON M) 10 MEQ extended release tablet Take 1 tablet by mouth daily 30 tablet 3   . phentermine (ADIPEX-P) 37.5 MG tablet Take 1 tablet by mouth every morning (before breakfast) for 30 days.. 30 tablet 0   . lansoprazole (PREVACID) 30 MG delayed release capsule TAKE ONE CAPSULE BY MOUTH ONCE DAILY 90 capsule 1   . SUMAtriptan (IMITREX) 100 MG tablet TAKE ONE TABLET BY MOUTH AS NEEDED FOR  MIGRAINE 27 tablet 1   . albuterol sulfate HFA 108 (90 Base) MCG/ACT inhaler Inhale 2 puffs into the lungs every 4 hours as needed for Wheezing or Shortness of Breath 1 Inhaler 0   . fluticasone (FLONASE) 50 MCG/ACT nasal spray 2 sprays by Nasal route daily 1 Bottle 0   . PHYSICIANS EZ USE JOINT/TUNNEL 40-1 MG/ML-% KIT      . diclofenac (VOLTAREN) 75 MG EC tablet Take 1 tablet by mouth 2 times daily 180 tablet 3   . acetaminophen-codeine (TYLENOL/CODEINE #3) 300-30 MG per tablet Take 1-2 tablets by mouth every 6 hours as needed for Pain 40 tablet 0   . Naproxen Sodium  (ALEVE PO) Take by mouth     . aspirin EC 81 MG EC tablet Take 1 tablet by mouth daily 30 tablet 3   . Multiple Vitamin (MULTIVITAMIN PO) Take  by mouth.         No current facility-administered medications for this visit.      PMH, Surgical Hx, Family Hx, and Social Hx reviewed and updated.  Health Maintenance reviewed.    Objective    Vitals:    08/29/16 1106   BP: 122/80   Pulse: 68   Resp: 16   Temp: 98.3 F (36.8 C)   SpO2: 98%   Weight: 173 lb 9.6 oz (78.7 kg)   Height: _0  (1.575 m)         Physical Exam   Constitutional: She appears well-nourished.   HENT:   Nose: Nose normal.   Mouth/Throat: Oropharynx is clear and moist.   Eyes: Conjunctivae are normal. Pupils are equal, round, and reactive to light.   Neck: Neck supple. No thyromegaly present.   Cardiovascular: Normal rate, regular rhythm and normal heart sounds.    No murmur heard.  Pulmonary/Chest: Breath sounds normal. She has no wheezes.   Abdominal: Soft. She exhibits no mass. There is no tenderness.   Musculoskeletal: She exhibits no edema.   Lymphadenopathy:     She has no cervical adenopathy.     EKG normal sinus rhythm      Assessment & Plan    Diagnosis Orders   1. Essential hypertension  TSH without Reflex   2. Migraine without status migrainosus, not intractable, unspecified migraine type stable on meds    3. Hypokalemia check labs Basic Metabolic Panel    potassium chloride (KLOR-CON M) 10 MEQ extended release tablet   4. Atrial septal defect  Basic Metabolic Panel    EKG 12 Lead   5. Palpitations  Basic Metabolic  Panel    TSH without Reflex    EKG 12 Lead   6. Obesity due to excess calories, unspecified classification, unspecified whether serious comorbidity present  phentermine (ADIPEX-P) 37.5 MG tablet     Orders Placed This Encounter   Procedures   . Basic Metabolic Panel     Standing Status:   Future     Number of Occurrences:   1     Standing Expiration Date:   08/29/2017   . TSH without Reflex     Standing Status:   Future     Number  of Occurrences:   1     Standing Expiration Date:   08/29/2017   . EKG 12 Lead     Order Specific Question:   Reason for Exam?     Answer:   Rhythm changes     Orders Placed This Encounter   Medications   . atenolol-chlorthalidone (TENORETIC) 50-25 MG per tablet     Sig: Take 1 tablet by mouth daily     Dispense:  30 tablet     Refill:  5   . atenolol (TENORMIN) 50 MG tablet     Sig: Take 1 tablet by mouth daily     Dispense:  30 tablet     Refill:  5   . potassium chloride (KLOR-CON M) 10 MEQ extended release tablet     Sig: Take 1 tablet by mouth daily     Dispense:  30 tablet     Refill:  3   . phentermine (ADIPEX-P) 37.5 MG tablet     Sig: Take 1 tablet by mouth every morning (before breakfast) for 30 days..     Dispense:  30 tablet     Refill:  0     Medications Discontinued During This Encounter   Medication Reason   . atenolol (TENORMIN) 50 MG tablet ERROR   . cyclobenzaprine (FLEXERIL) 10 MG tablet ERROR   . atenolol-chlorthalidone (TENORETIC) 50-25 MG per tablet ERROR   . atenolol-chlorthalidone (TENORETIC) 50-25 MG per tablet ERROR   . estrogens, conjugated, (PREMARIN) 0.3 MG tablet ERROR     No Follow-up on file.     Long talk.  She will need to talk to cardiology about adjusting her medications which may be contributing to her symptoms  The importance of a good diet and exercise regimen discussed.  Take medications as prescribed.    Risks and benefits of Adipex reviewed in detail.  Follow up as instructed.  Call if any problems             Benancio Deeds, MD          Please note this report has been partially produced using speech recognition software  And may cause contain errors related to that system including grammar, punctuation and spelling as well as words and phrases that may seem inappropriate. If there are questions or concerns please feel free to contact me to clarify.

## 2016-08-29 NOTE — Telephone Encounter (Signed)
Pharmacy called to verify meds sent today.    Pt was given both atenolol and tenoretic. Should pt be taking both meds.    Please clarify

## 2016-08-29 NOTE — Telephone Encounter (Signed)
Yes.  She takes one of each daily.  Please let the pharmacy know

## 2016-09-05 ENCOUNTER — Encounter: Primary: Family Medicine

## 2016-09-05 ENCOUNTER — Encounter

## 2016-09-06 LAB — BASIC METABOLIC PANEL
Anion Gap: 13 mEq/L (ref 7–13)
BUN: 26 mg/dL — ABNORMAL HIGH (ref 8–23)
CO2: 26 mEq/L (ref 22–29)
Calcium: 8.8 mg/dL (ref 8.6–10.2)
Chloride: 102 mEq/L (ref 98–107)
Creatinine: 1.23 mg/dL — ABNORMAL HIGH (ref 0.50–0.90)
GFR African American: 53.3 — ABNORMAL LOW (ref >60)
GFR Non-African American: 44.1 — ABNORMAL LOW (ref >60)
Glucose: 85 mg/dL (ref 74–109)
Potassium: 3.4 mEq/L — ABNORMAL LOW (ref 3.5–5.1)
Sodium: 141 mEq/L (ref 132–144)

## 2016-09-06 LAB — TSH: TSH: 0.722 u[IU]/mL (ref 0.270–4.200)

## 2016-09-20 NOTE — Progress Notes (Signed)
Labs WNL or stable EXCEPT POTASSIUM IS A LITTLE LOW.  Find out if she is still taking her potassium and how often  .She was also a little dry.  Increase water intake a little.  Let me  know and we will adjust her potassium dose and then arrange for repeat BMP in about 3 weeks    Notify pt.

## 2016-09-26 ENCOUNTER — Ambulatory Visit
Admit: 2016-09-26 | Discharge: 2016-09-26 | Payer: PRIVATE HEALTH INSURANCE | Attending: Family Medicine | Primary: Family Medicine

## 2016-09-26 DIAGNOSIS — E6609 Other obesity due to excess calories: Secondary | ICD-10-CM

## 2016-09-26 MED ORDER — PHENTERMINE HCL 37.5 MG PO TABS
37.5 | ORAL_TABLET | Freq: Every day | ORAL | 0 refills | Status: DC
Start: 2016-09-26 — End: 2016-10-24

## 2016-09-26 NOTE — Progress Notes (Signed)
Subjective  Deborah Crosby, 61 y.o. female presents today with:  Chief Complaint   Patient presents with   . Obesity     Patient presents today for a follow-up on Obesity. Is taking Adipex 37.5 MG. States she has no concerns with this medication. This will be her 2nd RX. Has lost 4 pounds since her last visit.   Marland Kitchen Discuss Labs     Would like to discuss blood work done on 09-05-16.           HPI    Patient presents today for a follow-up Lab work and weight loss.  She has lost almost 5 pounds.  Feels medication is working well   Taking current medications which were reviewed.  Problem list discussed.  Eating better.  Staying more active.    Overall doing well.  Has no other new problem / question.      Health Maintenance Is Up-To-Date       No other questions and or concerns for today's visit    Wt Readings from Last 3 Encounters:   09/26/16 169 lb 3.2 oz (76.7 kg)   08/29/16 173 lb 9.6 oz (78.7 kg)   08/05/16 173 lb (78.5 kg)       Review of Systems   Constitutional: Negative for activity change, appetite change, fatigue and fever.   HENT: Negative for ear pain.    Eyes: Negative.    Respiratory: Negative for cough and shortness of breath.    Cardiovascular: Negative for chest pain and palpitations.   Gastrointestinal: Negative for abdominal pain, constipation, diarrhea and nausea.   Genitourinary: Negative for dysuria and frequency.   Neurological: Negative for dizziness and headaches.         Past Medical History:   Diagnosis Date   . Family history of premature CAD 02/07/2014   . First degree AV block 02/07/2014   . GERD (gastroesophageal reflux disease)    . Gout    . Hypertension    . Migraine headache    . Migraine headache    . Precordial pain 02/07/2014     Past Surgical History:   Procedure Laterality Date   . APPENDECTOMY     . COLONOSCOPY  09/21/2009   . COLONOSCOPY  05/30/13    DR LISI   . COLONOSCOPY  08/28/2016    DR LISI,   5 YRS   . HYSTERECTOMY      partial   . TONSILLECTOMY     . UPPER  GASTROINTESTINAL ENDOSCOPY  09/21/2009     Social History     Social History   . Marital status: Married     Spouse name: Lilibeth Opie   . Number of children: 2   . Years of education: N/A     Occupational History   .  [employer]     Social History Main Topics   . Smoking status: Never Smoker   . Smokeless tobacco: Never Used   . Alcohol use No   . Drug use: No   . Sexual activity: Not on file     Other Topics Concern   . Not on file     Social History Narrative   . No narrative on file     Family History   Problem Relation Age of Onset   . Cancer Mother         breast   . Heart Disease Father    . Cancer Father  colon     No Known Allergies  Current Outpatient Prescriptions   Medication Sig Dispense Refill   . phentermine (ADIPEX-P) 37.5 MG tablet Take 1 tablet by mouth every morning (before breakfast) for 30 days.. 30 tablet 0   . cyclobenzaprine (FLEXERIL) 10 MG tablet as necessary     . estrogens, conjugated, (PREMARIN) 0.3 MG tablet Take 0.3 mg by mouth     . atenolol-chlorthalidone (TENORETIC) 50-25 MG per tablet Take 1 tablet by mouth daily 30 tablet 5   . atenolol (TENORMIN) 50 MG tablet Take 1 tablet by mouth daily 30 tablet 5   . potassium chloride (KLOR-CON M) 10 MEQ extended release tablet Take 1 tablet by mouth daily 30 tablet 3   . lansoprazole (PREVACID) 30 MG delayed release capsule TAKE ONE CAPSULE BY MOUTH ONCE DAILY 90 capsule 1   . SUMAtriptan (IMITREX) 100 MG tablet TAKE ONE TABLET BY MOUTH AS NEEDED FOR  MIGRAINE 27 tablet 1   . albuterol sulfate HFA 108 (90 Base) MCG/ACT inhaler Inhale 2 puffs into the lungs every 4 hours as needed for Wheezing or Shortness of Breath 1 Inhaler 0   . fluticasone (FLONASE) 50 MCG/ACT nasal spray 2 sprays by Nasal route daily 1 Bottle 0   . PHYSICIANS EZ USE JOINT/TUNNEL 40-1 MG/ML-% KIT      . diclofenac (VOLTAREN) 75 MG EC tablet Take 1 tablet by mouth 2 times daily 180 tablet 3   . acetaminophen-codeine (TYLENOL/CODEINE #3) 300-30 MG  per tablet Take 1-2 tablets by mouth every 6 hours as needed for Pain 40 tablet 0   . Naproxen Sodium (ALEVE PO) Take by mouth     . aspirin EC 81 MG EC tablet Take 1 tablet by mouth daily 30 tablet 3   . Multiple Vitamin (MULTIVITAMIN PO) Take  by mouth.         No current facility-administered medications for this visit.      PMH, Surgical Hx, Family Hx, and Social Hx reviewed and updated.  Health Maintenance reviewed.    Objective    Vitals:    09/26/16 1541   BP: 110/82   Pulse: 72   Resp: 12   Temp: 96.3 F (35.7 C)   TempSrc: Temporal   Weight: 169 lb 3.2 oz (76.7 kg)   Height: '5\' 2"'  (1.575 m)         Physical Exam   HENT:   Nose: Nose normal.   Mouth/Throat: Oropharynx is clear and moist.   Eyes: Conjunctivae are normal. Pupils are equal, round, and reactive to light.   Neck: Neck supple. No thyromegaly present.   Cardiovascular: Normal rate, regular rhythm and normal heart sounds.    No murmur heard.  Pulmonary/Chest: Breath sounds normal. She has no wheezes.   Abdominal: Soft. She exhibits no mass. There is no tenderness.   Lymphadenopathy:     She has no cervical adenopathy.       Assessment & Plan    Diagnosis Orders   1. Obesity due to excess calories, unspecified classification, unspecified whether serious comorbidity present  phentermine (ADIPEX-P) 37.5 MG tablet   2. Chronic pain syndrome Stable     3. Essential hypertension Stable       No orders of the defined types were placed in this encounter.    Orders Placed This Encounter   Medications   . phentermine (ADIPEX-P) 37.5 MG tablet     Sig: Take 1 tablet by mouth every morning (before breakfast) for  30 days..     Dispense:  30 tablet     Refill:  0     BMI: 30.95     Medications Discontinued During This Encounter   Medication Reason   . dicyclomine (BENTYL) 20 MG tablet Therapy completed   . pantoprazole (PROTONIX) 40 MG tablet Therapy completed   . phentermine (ADIPEX-P) 37.5 MG tablet REORDER     No Follow-up on file.     Long talk.  Health  maintenance issues reviewed and discussed with recommendations made.  The importance of a good diet and exercise regimen discussed.  Take medications as prescribed.    Follow up as instructed.  Call if any problems           Benancio Deeds, MD          Please note this report has been partially produced using speech recognition software  And may cause contain errors related to that system including grammar, punctuation and spelling as well as words and phrases that may seem inappropriate. If there are questions or concerns please feel free to contact me to clarify.

## 2016-09-29 MED ORDER — DICLOFENAC SODIUM 75 MG PO TBEC
75 MG | ORAL_TABLET | ORAL | 1 refills | Status: DC
Start: 2016-09-29 — End: 2017-04-23

## 2016-09-29 NOTE — Telephone Encounter (Signed)
Medication:   Requested Prescriptions     Pending Prescriptions Disp Refills   . diclofenac (VOLTAREN) 75 MG EC tablet [Pharmacy Med Name: DICLOFENAC 75MG  DR  TAB] 180 tablet 3     Sig: TAKE ONE TABLET BY MOUTH TWICE DAILY         Last Office Visit: 09-26-16    Last Labs: 09-05-16    Last Filled: 04-03-16

## 2016-10-24 ENCOUNTER — Encounter

## 2016-10-24 MED ORDER — PHENTERMINE HCL 37.5 MG PO TABS
37.5 | ORAL_TABLET | Freq: Every day | ORAL | 0 refills | Status: DC
Start: 2016-10-24 — End: 2017-04-23

## 2016-10-24 NOTE — Progress Notes (Signed)
Subjective  Deborah Crosby, 61 y.o. female presents today with:  Chief Complaint   Patient presents with   . Obesity     Patient presents today for a follow-up on Obesity. Is taking Adipex 37.5 MG. States she has no concerns with this medication. This will be her 3rd RX. Has lost 5 pounds since her last visit.   Marland Kitchen Discuss Labs     Thinks she's supposed to have her potassium level checked this visit.           HPI    Patient presents today Follow-up low potassium and reflux  Taking current medications which were reviewed.  Problem list discussed.  Eating better.  Losing weight on Adipex    Overall doing well.  Has no other new problem / question.      Health Maintenance Is Up-To-Date         No other questions and or concerns for today's visit    Wt Readings from Last 3 Encounters:   10/24/16 164 lb 6.4 oz (74.6 kg)   09/26/16 169 lb 3.2 oz (76.7 kg)   08/29/16 173 lb 9.6 oz (78.7 kg)       Review of Systems   Constitutional: Negative for activity change, appetite change, fatigue and fever.   HENT: Negative for ear pain.    Eyes: Negative.    Respiratory: Negative for cough and shortness of breath.    Cardiovascular: Negative for chest pain and palpitations.   Gastrointestinal: Negative for abdominal pain, constipation, diarrhea and nausea.   Genitourinary: Negative for dysuria and frequency.   Neurological: Negative for dizziness and headaches.         Past Medical History:   Diagnosis Date   . Family history of premature CAD 02/07/2014   . First degree AV block 02/07/2014   . GERD (gastroesophageal reflux disease)    . Gout    . Hypertension    . Migraine headache    . Migraine headache    . Precordial pain 02/07/2014     Past Surgical History:   Procedure Laterality Date   . APPENDECTOMY     . COLONOSCOPY  09/21/2009   . COLONOSCOPY  05/30/13    DR LISI   . COLONOSCOPY  08/28/2016    DR LISI,   5 YRS   . HYSTERECTOMY      partial   . TONSILLECTOMY     . UPPER GASTROINTESTINAL ENDOSCOPY  09/21/2009     Social  History     Social History   . Marital status: Married     Spouse name: Vail Basista   . Number of children: 2   . Years of education: N/A     Occupational History   .  [employer]     Social History Main Topics   . Smoking status: Never Smoker   . Smokeless tobacco: Never Used   . Alcohol use No   . Drug use: No   . Sexual activity: Not on file     Other Topics Concern   . Not on file     Social History Narrative   . No narrative on file     Family History   Problem Relation Age of Onset   . Cancer Mother         breast   . Heart Disease Father    . Cancer Father         colon  No Known Allergies  Current Outpatient Prescriptions   Medication Sig Dispense Refill   . phentermine (ADIPEX-P) 37.5 MG tablet Take 1 tablet by mouth every morning (before breakfast) for 30 days.. 30 tablet 0   . diclofenac (VOLTAREN) 75 MG EC tablet TAKE ONE TABLET BY MOUTH TWICE DAILY 180 tablet 1   . cyclobenzaprine (FLEXERIL) 10 MG tablet as necessary     . estrogens, conjugated, (PREMARIN) 0.3 MG tablet Take 0.3 mg by mouth     . atenolol-chlorthalidone (TENORETIC) 50-25 MG per tablet Take 1 tablet by mouth daily 30 tablet 5   . atenolol (TENORMIN) 50 MG tablet Take 1 tablet by mouth daily 30 tablet 5   . potassium chloride (KLOR-CON M) 10 MEQ extended release tablet Take 1 tablet by mouth daily 30 tablet 3   . lansoprazole (PREVACID) 30 MG delayed release capsule TAKE ONE CAPSULE BY MOUTH ONCE DAILY 90 capsule 1   . SUMAtriptan (IMITREX) 100 MG tablet TAKE ONE TABLET BY MOUTH AS NEEDED FOR  MIGRAINE 27 tablet 1   . albuterol sulfate HFA 108 (90 Base) MCG/ACT inhaler Inhale 2 puffs into the lungs every 4 hours as needed for Wheezing or Shortness of Breath 1 Inhaler 0   . fluticasone (FLONASE) 50 MCG/ACT nasal spray 2 sprays by Nasal route daily 1 Bottle 0   . PHYSICIANS EZ USE JOINT/TUNNEL 40-1 MG/ML-% KIT      . acetaminophen-codeine (TYLENOL/CODEINE #3) 300-30 MG per tablet Take 1-2 tablets by mouth every 6  hours as needed for Pain 40 tablet 0   . Naproxen Sodium (ALEVE PO) Take by mouth     . aspirin EC 81 MG EC tablet Take 1 tablet by mouth daily 30 tablet 3   . Multiple Vitamin (MULTIVITAMIN PO) Take  by mouth.         No current facility-administered medications for this visit.      PMH, Surgical Hx, Family Hx, and Social Hx reviewed and updated.  Health Maintenance reviewed.    Objective    Vitals:    10/24/16 1534   BP: 124/80   Pulse: 62   Resp: 12   Temp: 97.7 F (36.5 C)   TempSrc: Temporal   Weight: 164 lb 6.4 oz (74.6 kg)   Height: '5\' 2"'  (1.575 m)       Physical Exam   HENT:   Nose: Nose normal.   Mouth/Throat: Oropharynx is clear and moist.   Eyes: Pupils are equal, round, and reactive to light. Conjunctivae are normal.   Neck: Neck supple. No thyromegaly present.   Cardiovascular: Normal rate, regular rhythm and normal heart sounds.    No murmur heard.  Pulmonary/Chest: Breath sounds normal. She has no wheezes.   Abdominal: Soft. She exhibits no mass. There is no tenderness.   Lymphadenopathy:     She has no cervical adenopathy.       Assessment & Plan    Diagnosis Orders   1. Essential hypertension  Basic Metabolic Panel   2. Obesity due to excess calories, unspecified classification, unspecified whether serious comorbidity present  phentermine (ADIPEX-P) 37.5 MG tablet   3. Gastroesophageal reflux disease without esophagitis     4. Chronic pain syndrome       Orders Placed This Encounter   Procedures   . Basic Metabolic Panel     Standing Status:   Future     Number of Occurrences:   1     Standing Expiration Date:   10/24/2017  Orders Placed This Encounter   Medications   . phentermine (ADIPEX-P) 37.5 MG tablet     Sig: Take 1 tablet by mouth every morning (before breakfast) for 30 days..     Dispense:  30 tablet     Refill:  0     BMI: 30.07     Medications Discontinued During This Encounter   Medication Reason   . phentermine (ADIPEX-P) 37.5 MG tablet REORDER     No Follow-up on file.     Long  talk.    The importance of a good diet and exercise regimen discussed.  Take medications as prescribed.    Follow up as instructed.  Call if any problems           Benancio Deeds, MD          Please note this report has been partially produced using speech recognition software  And may cause contain errors related to that system including grammar, punctuation and spelling as well as words and phrases that may seem inappropriate. If there are questions or concerns please feel free to contact me to clarify.

## 2016-10-25 LAB — BASIC METABOLIC PANEL
Anion Gap: 14 mEq/L — ABNORMAL HIGH (ref 7–13)
BUN: 18 mg/dL (ref 8–23)
CO2: 29 mEq/L (ref 22–29)
Calcium: 9.6 mg/dL (ref 8.6–10.2)
Chloride: 98 mEq/L (ref 98–107)
Creatinine: 1.1 mg/dL — ABNORMAL HIGH (ref 0.50–0.90)
GFR African American: >60 (ref >60)
GFR Non-African American: 50.1 — ABNORMAL LOW (ref >60)
Glucose: 84 mg/dL (ref 74–109)
Potassium: 4.3 mEq/L (ref 3.5–5.1)
Sodium: 141 mEq/L (ref 132–144)

## 2016-10-26 NOTE — Progress Notes (Signed)
Labs WNL or stable.   POTASSIUM IS OK NOW    Notify pt.

## 2016-12-01 MED ORDER — CYCLOBENZAPRINE HCL 10 MG PO TABS
10 MG | ORAL_TABLET | ORAL | 1 refills | Status: DC
Start: 2016-12-01 — End: 2017-04-23

## 2016-12-01 NOTE — Telephone Encounter (Signed)
Last seen 10-2016

## 2017-01-02 ENCOUNTER — Encounter

## 2017-01-02 MED ORDER — KLOR-CON M10 10 MEQ PO TBCR
10 MEQ | ORAL_TABLET | ORAL | 3 refills | Status: DC
Start: 2017-01-02 — End: 2017-04-23

## 2017-01-02 NOTE — Telephone Encounter (Signed)
Last seen 10/2016

## 2017-01-16 NOTE — Telephone Encounter (Signed)
Last seen 10/2016

## 2017-01-17 MED ORDER — SUMATRIPTAN SUCCINATE 100 MG PO TABS
100 MG | ORAL_TABLET | ORAL | 3 refills | Status: DC
Start: 2017-01-17 — End: 2017-04-23

## 2017-02-01 ENCOUNTER — Encounter

## 2017-02-02 MED ORDER — LANSOPRAZOLE 30 MG PO CPDR
30 MG | ORAL_CAPSULE | ORAL | 0 refills | Status: DC
Start: 2017-02-02 — End: 2017-04-23

## 2017-02-02 NOTE — Telephone Encounter (Signed)
Last seen 10/2016

## 2017-03-06 MED ORDER — ATENOLOL-CHLORTHALIDONE 50-25 MG PO TABS
50-25 MG | ORAL_TABLET | ORAL | 5 refills | Status: AC
Start: 2017-03-06 — End: ?

## 2017-03-06 NOTE — Telephone Encounter (Signed)
Hiti pt.    LOV: 10/24/16

## 2017-03-23 MED ORDER — PREMARIN 0.3 MG PO TABS
0.3 MG | ORAL_TABLET | ORAL | 0 refills | Status: AC
Start: 2017-03-23 — End: ?

## 2017-03-23 NOTE — Telephone Encounter (Signed)
Last seen 10-2016

## 2017-04-08 MED ORDER — AMOXICILLIN-POT CLAVULANATE 875-125 MG PO TABS
875-125 MG | ORAL_TABLET | Freq: Two times a day (BID) | ORAL | 0 refills | Status: AC
Start: 2017-04-08 — End: 2017-04-15

## 2017-04-08 NOTE — Telephone Encounter (Signed)
LMOM for patient to call back.

## 2017-04-08 NOTE — Telephone Encounter (Signed)
E prescribed med to their pharmacy.  Call pt. And inform.

## 2017-04-08 NOTE — Telephone Encounter (Signed)
Please call her and find out if she usually responds to prednisone/steroid or antibiotics  Please let me know and we can send it in

## 2017-04-08 NOTE — Telephone Encounter (Signed)
Patient is aware.

## 2017-04-08 NOTE — Telephone Encounter (Signed)
Pt is calling because she is having a flare up of colitis. She states she is unable to make it in for an appt today because of work. Wanted to know if you could call in something to her pharmacy  DOL visit 10/24/16  Please advise  Thanks          Preferred pharmacy [facility]

## 2017-04-08 NOTE — Telephone Encounter (Signed)
Patient returned call and stated anything that will work the best is ok with her.    Please advise

## 2017-04-20 MED ORDER — METHYLPREDNISOLONE 4 MG PO TBPK
4 MG | PACK | ORAL | 0 refills | Status: DC
Start: 2017-04-20 — End: 2017-04-23

## 2017-04-20 NOTE — Telephone Encounter (Signed)
E prescribed med to their pharmacy.  Call pt. And inform.

## 2017-04-20 NOTE — Telephone Encounter (Signed)
Pt called asking for the prednisone refill.  She took the antibiotic was doing better but now has another flare up.  She would like to take the prednisone and she made an appt for this Thursday to discuss maybe other options.

## 2017-04-20 NOTE — Telephone Encounter (Signed)
LMOM for pt that rx sent

## 2017-04-23 ENCOUNTER — Encounter

## 2017-04-23 ENCOUNTER — Ambulatory Visit
Admit: 2017-04-23 | Discharge: 2017-04-23 | Payer: PRIVATE HEALTH INSURANCE | Attending: Family Medicine | Primary: Family Medicine

## 2017-04-23 DIAGNOSIS — K529 Noninfective gastroenteritis and colitis, unspecified: Secondary | ICD-10-CM

## 2017-04-23 LAB — CBC WITH AUTO DIFFERENTIAL
Basophils %: 0.9 %
Basophils Absolute: 0.1 10*3/uL (ref 0.0–0.2)
Eosinophils %: 0.3 %
Eosinophils Absolute: 0 10*3/uL (ref 0.0–0.7)
Hematocrit: 38.6 % (ref 37.0–47.0)
Hemoglobin: 12.7 g/dL (ref 12.0–16.0)
Lymphocytes %: 35 %
Lymphocytes Absolute: 2.7 10*3/uL (ref 1.0–4.8)
MCH: 29.9 pg (ref 27.0–31.3)
MCHC: 32.9 % — ABNORMAL LOW (ref 33.0–37.0)
MCV: 90.9 fL (ref 82.0–100.0)
Monocytes %: 6.2 %
Monocytes Absolute: 0.5 10*3/uL (ref 0.2–0.8)
Neutrophils %: 57.6 %
Neutrophils Absolute: 4.5 10*3/uL (ref 1.4–6.5)
Platelets: 251 10*3/uL (ref 130–400)
RBC: 4.25 M/uL (ref 4.20–5.40)
RDW: 13.7 % (ref 11.5–14.5)
WBC: 7.8 10*3/uL (ref 4.8–10.8)

## 2017-04-23 LAB — COMPREHENSIVE METABOLIC PANEL
ALT: 48 U/L — ABNORMAL HIGH (ref 0–33)
AST: 31 U/L (ref 0–35)
Albumin: 4.5 g/dL (ref 3.5–4.6)
Alkaline Phosphatase: 53 U/L (ref 40–130)
Anion Gap: 14 mEq/L (ref 9–15)
BUN: 23 mg/dL (ref 8–23)
CO2: 26 mEq/L (ref 20–31)
Calcium: 9.2 mg/dL (ref 8.5–9.9)
Chloride: 103 mEq/L (ref 95–107)
Creatinine: 0.91 mg/dL — ABNORMAL HIGH (ref 0.50–0.90)
GFR African American: >60 (ref >60)
GFR Non-African American: >60 (ref >60)
Globulin: 2.8 g/dL (ref 2.3–3.5)
Glucose: 83 mg/dL (ref 70–99)
Potassium: 4.3 mEq/L (ref 3.4–4.9)
Sodium: 143 mEq/L (ref 135–144)
Total Bilirubin: <0.2 mg/dL (ref 0.2–0.7)
Total Protein: 7.3 g/dL (ref 6.3–8.0)

## 2017-04-23 MED ORDER — CYCLOBENZAPRINE HCL 10 MG PO TABS
10 | ORAL_TABLET | ORAL | 3 refills | Status: AC
Start: 2017-04-23 — End: ?

## 2017-04-23 MED ORDER — PHENTERMINE HCL 37.5 MG PO TABS
37.5 | ORAL_TABLET | Freq: Every day | ORAL | 0 refills | Status: AC
Start: 2017-04-23 — End: 2017-05-23

## 2017-04-23 MED ORDER — METRONIDAZOLE 250 MG PO TABS
250 MG | ORAL_TABLET | Freq: Three times a day (TID) | ORAL | 0 refills | Status: AC
Start: 2017-04-23 — End: 2017-05-03

## 2017-04-23 MED ORDER — POTASSIUM CHLORIDE CRYS ER 10 MEQ PO TBCR
10 | ORAL_TABLET | ORAL | 3 refills | Status: AC
Start: 2017-04-23 — End: ?

## 2017-04-23 MED ORDER — LANSOPRAZOLE 30 MG PO CPDR
30 | ORAL_CAPSULE | ORAL | 3 refills | Status: AC
Start: 2017-04-23 — End: ?

## 2017-04-23 MED ORDER — DICLOFENAC SODIUM 75 MG PO TBEC
75 | ORAL_TABLET | ORAL | 3 refills | Status: AC
Start: 2017-04-23 — End: ?

## 2017-04-23 MED ORDER — SUMATRIPTAN SUCCINATE 100 MG PO TABS
100 | ORAL_TABLET | ORAL | 3 refills | Status: AC
Start: 2017-04-23 — End: ?

## 2017-04-23 NOTE — Progress Notes (Signed)
Subjective  Deborah Crosby, 62 y.o. female presents today with:  Chief Complaint   Patient presents with   . Ulcerative Colitis        . Obesity                HPI  Patient presents today to discuss Colitis. On 04-08-17 called for a flare-up prescribed Augmentin. On 04-20-17 called again, and prescribed Medrol Dospack. Would like to discuss options for her flare-ups. Is currently having bloody stools, diarrhea, frequent bowell movements, and abdominal cramping.      Would like to discuss going back on Adipex. Last RX 10-2016. Wants to lose at least 30 pounds.        Taking current medications which were reviewed.  Problem list discussed.  Trying to stay active    Overall doing well.  Has no other new problem / question.      Health Maintenance reviewed with patient at visit          No other questions and or concerns for today's visit    Wt Readings from Last 3 Encounters:   04/23/17 173 lb 3.2 oz (78.6 kg)   10/24/16 164 lb 6.4 oz (74.6 kg)   09/26/16 169 lb 3.2 oz (76.7 kg)       Review of Systems   Constitutional: Negative for activity change, appetite change, fatigue and fever.   HENT: Negative for ear pain.    Eyes: Negative.    Respiratory: Negative for cough and shortness of breath.    Cardiovascular: Negative for chest pain and palpitations.   Gastrointestinal: Positive for abdominal pain, blood in stool and diarrhea. Negative for constipation and nausea.   Genitourinary: Negative for dysuria and frequency.   Neurological: Negative for dizziness and headaches.         Past Medical History:   Diagnosis Date   . Family history of premature CAD 02/07/2014   . First degree AV block 02/07/2014   . GERD (gastroesophageal reflux disease)    . Gout    . Hypertension    . Migraine headache    . Migraine headache    . Precordial pain 02/07/2014     Past Surgical History:   Procedure Laterality Date   . APPENDECTOMY     . COLONOSCOPY  09/21/2009   . COLONOSCOPY  05/30/13    DR LISI   . COLONOSCOPY  08/28/2016    DR  LISI,   5 YRS   . HYSTERECTOMY      partial   . TONSILLECTOMY     . UPPER GASTROINTESTINAL ENDOSCOPY  09/21/2009     Social History     Social History   . Marital status: Married     Spouse name: Aarilyn Dye   . Number of children: 2   . Years of education: N/A     Occupational History   .  [employer]     Social History Main Topics   . Smoking status: Never Smoker   . Smokeless tobacco: Never Used   . Alcohol use No   . Drug use: No   . Sexual activity: Not on file     Other Topics Concern   . Not on file     Social History Narrative   . No narrative on file     Family History   Problem Relation Age of Onset   . Cancer Mother         breast   .  Heart Disease Father    . Cancer Father         colon     No Known Allergies  Current Outpatient Prescriptions   Medication Sig Dispense Refill   . PREMARIN 0.3 MG tablet TAKE ONE TABLET BY MOUTH ONCE DAILY 90 tablet 0   . atenolol-chlorthalidone (TENORETIC) 50-25 MG per tablet TAKE 1 TABLET BY MOUTH ONCE DAILY 30 tablet 5   . lansoprazole (PREVACID) 30 MG delayed release capsule TAKE 1 CAPSULE BY MOUTH ONCE DAILY 90 capsule 0   . SUMAtriptan (IMITREX) 100 MG tablet TAKE 1 TABLET BY MOUTH AS NEEDED FOR MIGRAINE 9 tablet 3   . KLOR-CON M10 10 MEQ extended release tablet TAKE 1 TABLET BY MOUTH ONCE DAILY 30 tablet 3   . cyclobenzaprine (FLEXERIL) 10 MG tablet TAKE 1 TABLET BY MOUTH THREE TIMES DAILY AS NEEDED FOR MUSCLE SPASMS 180 tablet 1   . diclofenac (VOLTAREN) 75 MG EC tablet TAKE ONE TABLET BY MOUTH TWICE DAILY 180 tablet 1   . albuterol sulfate HFA 108 (90 Base) MCG/ACT inhaler Inhale 2 puffs into the lungs every 4 hours as needed for Wheezing or Shortness of Breath 1 Inhaler 0   . Naproxen Sodium (ALEVE PO) Take by mouth     . aspirin EC 81 MG EC tablet Take 1 tablet by mouth daily 30 tablet 3   . Multiple Vitamin (MULTIVITAMIN PO) Take  by mouth.         No current facility-administered medications for this visit.      PMH, Surgical Hx, Family Hx,  and Social Hx reviewed and updated.  Health Maintenance reviewed.    Objective    Vitals:    04/23/17 0845   BP: 130/82   Pulse: 68   Resp: 12   Temp: 97.4 F (36.3 C)   TempSrc: Temporal   Weight: 173 lb 3.2 oz (78.6 kg)   Height: '5\' 2"'  (1.575 m)       Physical Exam   HENT:   Nose: Nose normal.   Mouth/Throat: Oropharynx is clear and moist.   Eyes: Pupils are equal, round, and reactive to light. Conjunctivae are normal.   Neck: Neck supple. No thyromegaly present.   Cardiovascular: Normal rate, regular rhythm and normal heart sounds.    No murmur heard.  Pulmonary/Chest: Breath sounds normal. She has no wheezes.   Abdominal: Soft. She exhibits no distension and no mass. There is no tenderness.   No localizing point tenderness   Lymphadenopathy:     She has no cervical adenopathy.   Psychiatric: She has a normal mood and affect.       Assessment & Plan    Diagnosis Orders   1. Colitis  Clostridium Difficile Toxin/Antigen    Gastrointestinal Panel by DNA    Comprehensive Metabolic Panel    CBC Auto Differential   2. Essential hypertension  Comprehensive Metabolic Panel    CBC Auto Differential   3. Gastroesophageal reflux disease without esophagitis  lansoprazole (PREVACID) 30 MG delayed release capsule   4. Hypokalemia  potassium chloride (KLOR-CON M10) 10 MEQ extended release tablet   5. Obesity due to excess calories, unspecified classification, unspecified whether serious comorbidity present  phentermine (ADIPEX-P) 37.5 MG tablet   6. Diarrhea, unspecified type  Clostridium Difficile Toxin/Antigen    Gastrointestinal Panel by DNA    Comprehensive Metabolic Panel    CBC Auto Differential     Orders Placed This Encounter   Procedures   . Clostridium  Difficile Toxin/Antigen     Standing Status:   Future     Number of Occurrences:   1     Standing Expiration Date:   04/23/2018   . Gastrointestinal Panel by DNA     Standing Status:   Future     Number of Occurrences:   1     Standing Expiration Date:   04/23/2018   .  Comprehensive Metabolic Panel     Standing Status:   Future     Number of Occurrences:   1     Standing Expiration Date:   04/23/2018   . CBC Auto Differential     Standing Status:   Future     Number of Occurrences:   1     Standing Expiration Date:   04/23/2018     Orders Placed This Encounter   Medications   . lansoprazole (PREVACID) 30 MG delayed release capsule     Sig: TAKE 1 CAPSULE BY MOUTH ONCE DAILY     Dispense:  90 capsule     Refill:  3   . SUMAtriptan (IMITREX) 100 MG tablet     Sig: TAKE 1 TABLET BY MOUTH AS NEEDED FOR MIGRAINE     Dispense:  18 tablet     Refill:  3   . potassium chloride (KLOR-CON M10) 10 MEQ extended release tablet     Sig: TAKE 1 TABLET BY MOUTH ONCE DAILY     Dispense:  90 tablet     Refill:  3   . cyclobenzaprine (FLEXERIL) 10 MG tablet     Sig: TAKE 1 TABLET BY MOUTH THREE TIMES DAILY AS NEEDED FOR MUSCLE SPASMS     Dispense:  180 tablet     Refill:  3   . diclofenac (VOLTAREN) 75 MG EC tablet     Sig: TAKE ONE TABLET BY MOUTH TWICE DAILY     Dispense:  180 tablet     Refill:  3   . phentermine (ADIPEX-P) 37.5 MG tablet     Sig: Take 1 tablet by mouth every morning (before breakfast) for 30 days..     Dispense:  30 tablet     Refill:  0     BMI: 31.68   . metroNIDAZOLE (FLAGYL) 250 MG tablet     Sig: Take 1 tablet by mouth 3 times daily for 10 days     Dispense:  30 tablet     Refill:  0     Medications Discontinued During This Encounter   Medication Reason   . cyclobenzaprine (FLEXERIL) 10 MG tablet DUPLICATE   . methylPREDNISolone (MEDROL DOSEPACK) 4 MG tablet Therapy completed   . acetaminophen-codeine (TYLENOL/CODEINE #3) 300-30 MG per tablet Therapy completed   . atenolol (TENORMIN) 50 MG tablet Alternate therapy   . fluticasone (FLONASE) 50 MCG/ACT nasal spray Therapy completed   . PHYSICIANS EZ USE JOINT/TUNNEL 40-1 MG/ML-% KIT Therapy completed   . estrogens, conjugated, (PREMARIN) 0.3 MG tablet DUPLICATE   . lansoprazole (PREVACID) 30 MG delayed release capsule REORDER    . SUMAtriptan (IMITREX) 100 MG tablet REORDER   . KLOR-CON M10 10 MEQ extended release tablet REORDER   . cyclobenzaprine (FLEXERIL) 10 MG tablet REORDER   . diclofenac (VOLTAREN) 75 MG EC tablet REORDER   . phentermine (ADIPEX-P) 37.5 MG tablet REORDER     No Follow-up on file.     Long talk.  Human options reviewed  The importance of a good diet and exercise regimen  discussed.  Take medications as prescribed.  Further workup and treatment depending on how she does and test results  Follow up as instructed.  Call if any problems             Benancio Deeds, MD          Please note this report has been partiallyproduced using speech recognition software  And may cause contain errors related to that system including grammar, punctuation and spelling as well as words and phrases that may seem inappropriate. If there are questionsor concerns please feel free to contact me to clarify.

## 2017-04-23 NOTE — Progress Notes (Signed)
Labs WNL or stable.       Notify pt.

## 2017-04-24 LAB — GASTROINTESTINAL PANEL, MOLECULAR: GI Bacterial Pathogens By PCR: NOT DETECTED

## 2017-04-24 LAB — C DIFF TOXIN/ANTIGEN: C difficile Toxin, EIA: NEGATIVE

## 2017-04-26 NOTE — Progress Notes (Signed)
Stool tests are negative.  Follow up if symptoms persist    Notify pt.

## 2017-04-27 ENCOUNTER — Encounter

## 2017-04-29 ENCOUNTER — Encounter

## 2017-04-30 NOTE — Progress Notes (Signed)
MAM WNL/STABLE.  Recheck in 1 year.    Notify patient.

## 2017-04-30 NOTE — Progress Notes (Signed)
Letter Sent

## 2017-05-29 ENCOUNTER — Ambulatory Visit
Admit: 2017-05-29 | Discharge: 2017-05-29 | Payer: PRIVATE HEALTH INSURANCE | Attending: Family | Primary: Family Medicine

## 2017-05-29 DIAGNOSIS — R233 Spontaneous ecchymoses: Secondary | ICD-10-CM

## 2017-05-29 NOTE — Progress Notes (Signed)
Subjective  Deborah Crosby, 62 y.o. female presents today with:  Chief Complaint   Patient presents with   ??? Spots and/or Floaters     noticed 2 days ago she had red spots all over her body, they do not itch, they are just there       Rash   This is a new problem. Episode onset: 2days. The problem is unchanged. The affected locations include the left lower leg, right lower leg, right upper leg, left upper leg and abdomen. Rash characteristics: pinpoint spots. She was exposed to nothing. Pertinent negatives include no anorexia, congestion, cough, fatigue, fever, joint pain, rhinorrhea, shortness of breath or vomiting. Past treatments include nothing.         Review of Systems   Constitutional: Negative for chills, fatigue and fever.   HENT: Negative for congestion and rhinorrhea.    Respiratory: Negative for cough, chest tightness, shortness of breath and wheezing.    Cardiovascular: Negative for chest pain and leg swelling.   Gastrointestinal: Negative for anorexia and vomiting.   Musculoskeletal: Negative for arthralgias, gait problem, joint pain, joint swelling and myalgias.   Skin: Positive for rash.   Neurological: Negative for weakness and numbness.       Past Medical History:   Diagnosis Date   ??? Family history of premature CAD 02/07/2014   ??? First degree AV block 02/07/2014   ??? GERD (gastroesophageal reflux disease)    ??? Gout    ??? Hypertension    ??? Migraine headache    ??? Migraine headache    ??? Precordial pain 02/07/2014     Past Surgical History:   Procedure Laterality Date   ??? APPENDECTOMY     ??? COLONOSCOPY  09/21/2009   ??? COLONOSCOPY  05/30/13    DR LISI   ??? COLONOSCOPY  08/28/2016    DR LISI,   5 YRS   ??? HYSTERECTOMY      partial   ??? TONSILLECTOMY     ??? UPPER GASTROINTESTINAL ENDOSCOPY  09/21/2009     Social History     Socioeconomic History   ??? Marital status: Married     Spouse name: Yarlin Breisch   ??? Number of children: 2   ??? Years of education: Not on file   ??? Highest education level: Not on file    Occupational History     Employer: [employer]   Social Needs   ??? Financial resource strain: Not on file   ??? Food insecurity:     Worry: Not on file     Inability: Not on file   ??? Transportation needs:     Medical: Not on file     Non-medical: Not on file   Tobacco Use   ??? Smoking status: Never Smoker   ??? Smokeless tobacco: Never Used   Substance and Sexual Activity   ??? Alcohol use: No     Alcohol/week: 0.0 oz   ??? Drug use: No   ??? Sexual activity: Not on file   Lifestyle   ??? Physical activity:     Days per week: Not on file     Minutes per session: Not on file   ??? Stress: Not on file   Relationships   ??? Social connections:     Talks on phone: Not on file     Gets together: Not on file     Attends religious service: Not on file     Active member of  club or organization: Not on file     Attends meetings of clubs or organizations: Not on file     Relationship status: Not on file   ??? Intimate partner violence:     Fear of current or ex partner: Not on file     Emotionally abused: Not on file     Physically abused: Not on file     Forced sexual activity: Not on file   Other Topics Concern   ??? Not on file   Social History Narrative   ??? Not on file     Family History   Problem Relation Age of Onset   ??? Cancer Mother         breast   ??? Heart Disease Father    ??? Cancer Father         colon     No Known Allergies  Current Outpatient Medications   Medication Sig Dispense Refill   ??? lansoprazole (PREVACID) 30 MG delayed release capsule TAKE 1 CAPSULE BY MOUTH ONCE DAILY 90 capsule 3   ??? SUMAtriptan (IMITREX) 100 MG tablet TAKE 1 TABLET BY MOUTH AS NEEDED FOR MIGRAINE 18 tablet 3   ??? potassium chloride (KLOR-CON M10) 10 MEQ extended release tablet TAKE 1 TABLET BY MOUTH ONCE DAILY 90 tablet 3   ??? cyclobenzaprine (FLEXERIL) 10 MG tablet TAKE 1 TABLET BY MOUTH THREE TIMES DAILY AS NEEDED FOR MUSCLE SPASMS 180 tablet 3   ??? diclofenac (VOLTAREN) 75 MG EC tablet TAKE ONE TABLET BY MOUTH TWICE DAILY 180 tablet  3   ??? PREMARIN 0.3 MG tablet TAKE ONE TABLET BY MOUTH ONCE DAILY 90 tablet 0   ??? atenolol-chlorthalidone (TENORETIC) 50-25 MG per tablet TAKE 1 TABLET BY MOUTH ONCE DAILY 30 tablet 5   ??? albuterol sulfate HFA 108 (90 Base) MCG/ACT inhaler Inhale 2 puffs into the lungs every 4 hours as needed for Wheezing or Shortness of Breath 1 Inhaler 0   ??? Naproxen Sodium (ALEVE PO) Take by mouth     ??? aspirin EC 81 MG EC tablet Take 1 tablet by mouth daily 30 tablet 3   ??? Multiple Vitamin (MULTIVITAMIN PO) Take  by mouth.         No current facility-administered medications for this visit.      PMH, Surgical Hx, Family Hx, and Social Hx reviewed and updated.  Health Maintenance reviewed.    Objective  Vitals:    05/29/17 1609   BP: 122/84   Pulse: 85   Resp: 20   Temp: 98.3 ??F (36.8 ??C)   TempSrc: Temporal   SpO2: 98%   Weight: 176 lb (79.8 kg)   Height: 5\' 2"  (1.575 m)     BP Readings from Last 3 Encounters:   05/29/17 122/84   04/23/17 130/82   10/24/16 124/80     Wt Readings from Last 3 Encounters:   05/29/17 176 lb (79.8 kg)   04/23/17 173 lb 3.2 oz (78.6 kg)   10/24/16 164 lb 6.4 oz (74.6 kg)     Physical Exam   Constitutional: She is oriented to person, place, and time. She appears well-developed and well-nourished. She is active.   HENT:   Right Ear: Tympanic membrane normal.   Left Ear: Tympanic membrane normal.   Nose: Rhinorrhea present.   Mouth/Throat: Uvula is midline, oropharynx is clear and moist and mucous membranes are normal.   Eyes: Pupils are equal, round, and reactive to light. Conjunctivae and EOM are normal.  Neck: Full passive range of motion without pain.   Cardiovascular: Normal rate, regular rhythm, S1 normal, S2 normal, normal heart sounds and intact distal pulses.   Pulmonary/Chest: Effort normal and breath sounds normal.   Abdominal: Soft. Normal appearance and bowel sounds are normal. There is no tenderness.   Musculoskeletal: Normal range of motion.   Neurological: She is alert and oriented to  person, place, and time. No cranial nerve deficit or sensory deficit.   Skin: Skin is warm, dry and intact. Petechiae (bilateral lower extremities) noted.   Psychiatric: She has a normal mood and affect. Her speech is normal and behavior is normal.     Assessment & Plan    Diagnosis Orders   1. Petechiae       There are no discontinued medications.  Return in about 2 weeks (around 06/12/2017) for follow up with PCP.    Reviewed with the patient: current clinical status,medications, activities and diet.     Follow up with PCP for further evaluation.    Side effects, adverse effects of themedication prescribed today, as well as treatment plan/ rationale and result expectations have been discussed with the patient who expresses understanding and desires to proceed.    Close follow up to evaluate treatment results and for coordination of care.  I have reviewed the patient's medical history in detail and updated the computerized patient record.    Sharen Counter, APRN

## 2017-06-01 ENCOUNTER — Encounter: Attending: Family Medicine | Primary: Family Medicine

## 2017-06-03 ENCOUNTER — Ambulatory Visit
Admit: 2017-06-03 | Discharge: 2017-06-03 | Payer: PRIVATE HEALTH INSURANCE | Attending: Family Medicine | Primary: Family Medicine

## 2017-06-03 ENCOUNTER — Encounter

## 2017-06-03 DIAGNOSIS — R233 Spontaneous ecchymoses: Secondary | ICD-10-CM

## 2017-06-03 MED ORDER — PHENTERMINE HCL 37.5 MG PO TABS
37.5 | ORAL_TABLET | Freq: Every day | ORAL | 0 refills | Status: AC
Start: 2017-06-03 — End: 2017-07-03

## 2017-06-03 MED ORDER — PREDNISONE 10 MG PO TABS
10 MG | ORAL_TABLET | ORAL | 0 refills | Status: AC
Start: 2017-06-03 — End: 2017-06-13

## 2017-06-03 NOTE — Progress Notes (Signed)
Subjective  Deborah Crosby, 62 y.o. female presents today with:  Chief Complaint   Patient presents with   . Rash     Patient in office being seen for a follow up 05/29/17, seen Coralee Pesa for Petechiae- reports that nothing was rx'ed.            HPI    Patient in for follow-up petechial rash seen by nurse practitioner walk-in clinic note.  The nurse practitioner did not even know what petechial were.    No other questions and or concerns for today's visit      Review of Systems   Constitutional: Negative for appetite change, fatigue and fever.   HENT: Negative for congestion, ear pain, sinus pressure and sore throat.    Eyes: Negative for discharge and itching.   Respiratory: Negative for cough and shortness of breath.    Cardiovascular: Negative for chest pain and palpitations.   Gastrointestinal: Negative for abdominal pain, constipation and diarrhea.   Endocrine: Negative for polydipsia.   Genitourinary: Negative for difficulty urinating.   Musculoskeletal: Negative for gait problem.   Skin: Positive for rash.   Neurological: Negative for dizziness.   Hematological: Negative.    Psychiatric/Behavioral: Negative.          Past Medical History:   Diagnosis Date   . Family history of premature CAD 02/07/2014   . First degree AV block 02/07/2014   . GERD (gastroesophageal reflux disease)    . Gout    . Hypertension    . Migraine headache    . Migraine headache    . Precordial pain 02/07/2014     Past Surgical History:   Procedure Laterality Date   . APPENDECTOMY     . COLONOSCOPY  09/21/2009   . COLONOSCOPY  05/30/13    DR LISI   . COLONOSCOPY  08/28/2016    DR LISI,   5 YRS   . HYSTERECTOMY      partial   . TONSILLECTOMY     . UPPER GASTROINTESTINAL ENDOSCOPY  09/21/2009     Social History     Socioeconomic History   . Marital status: Married     Spouse name: Angeliah Tabor   . Number of children: 2   . Years of education: Not on file   . Highest education level: Not on file   Occupational History     Employer:  [employer]   Social Needs   . Financial resource strain: Not on file   . Food insecurity:     Worry: Not on file     Inability: Not on file   . Transportation needs:     Medical: Not on file     Non-medical: Not on file   Tobacco Use   . Smoking status: Never Smoker   . Smokeless tobacco: Never Used   Substance and Sexual Activity   . Alcohol use: No     Alcohol/week: 0.0 oz   . Drug use: No   . Sexual activity: Not on file   Lifestyle   . Physical activity:     Days per week: Not on file     Minutes per session: Not on file   . Stress: Not on file   Relationships   . Social connections:     Talks on phone: Not on file     Gets together: Not on file     Attends religious service: Not on file  Active member of club or organization: Not on file     Attends meetings of clubs or organizations: Not on file     Relationship status: Not on file   . Intimate partner violence:     Fear of current or ex partner: Not on file     Emotionally abused: Not on file     Physically abused: Not on file     Forced sexual activity: Not on file   Other Topics Concern   . Not on file   Social History Narrative   . Not on file     Family History   Problem Relation Age of Onset   . Cancer Mother         breast   . Heart Disease Father    . Cancer Father         colon     No Known Allergies  Current Outpatient Medications   Medication Sig Dispense Refill   . lansoprazole (PREVACID) 30 MG delayed release capsule TAKE 1 CAPSULE BY MOUTH ONCE DAILY 90 capsule 3   . SUMAtriptan (IMITREX) 100 MG tablet TAKE 1 TABLET BY MOUTH AS NEEDED FOR MIGRAINE 18 tablet 3   . potassium chloride (KLOR-CON M10) 10 MEQ extended release tablet TAKE 1 TABLET BY MOUTH ONCE DAILY 90 tablet 3   . cyclobenzaprine (FLEXERIL) 10 MG tablet TAKE 1 TABLET BY MOUTH THREE TIMES DAILY AS NEEDED FOR MUSCLE SPASMS 180 tablet 3   . diclofenac (VOLTAREN) 75 MG EC tablet TAKE ONE TABLET BY MOUTH TWICE DAILY 180 tablet 3   . PREMARIN 0.3 MG tablet TAKE ONE  TABLET BY MOUTH ONCE DAILY 90 tablet 0   . atenolol-chlorthalidone (TENORETIC) 50-25 MG per tablet TAKE 1 TABLET BY MOUTH ONCE DAILY 30 tablet 5   . albuterol sulfate HFA 108 (90 Base) MCG/ACT inhaler Inhale 2 puffs into the lungs every 4 hours as needed for Wheezing or Shortness of Breath 1 Inhaler 0   . Naproxen Sodium (ALEVE PO) Take by mouth     . aspirin EC 81 MG EC tablet Take 1 tablet by mouth daily 30 tablet 3   . Multiple Vitamin (MULTIVITAMIN PO) Take  by mouth.         No current facility-administered medications for this visit.      PMH, Surgical Hx, Family Hx, and Social Hx reviewed and updated.  Health Maintenance reviewed.    Objective    Vitals:    06/03/17 1517   BP: 118/82   Pulse: 80   Resp: 20   Temp: 97 F (36.1 C)   TempSrc: Temporal   Weight: 174 lb (78.9 kg)   Height: 5\' 2"  (1.575 m)       Physical Exam   Constitutional: She is oriented to person, place, and time. She appears well-developed and well-nourished.   HENT:   Head: Normocephalic.   Mouth/Throat: Oropharynx is clear and moist.   Eyes: Pupils are equal, round, and reactive to light.   Neck: Normal range of motion. Neck supple. No thyromegaly present.   Cardiovascular: Normal rate and intact distal pulses. Exam reveals no gallop and no friction rub.   No murmur heard.  Pulmonary/Chest: Effort normal and breath sounds normal.   Abdominal: Soft. Bowel sounds are normal.   Musculoskeletal: Normal range of motion.   Neurological: She is alert and oriented to person, place, and time.   Skin: Skin is warm and dry.   Psychiatric: She has a normal mood  and affect. Her behavior is normal.     Patient has a few tiny less than 1 mm sized petechiae mostly on the lower legs she on the upper thigh and some scattered on the abdomen this is minimal at best.  She has not had any new medications not been ill no new supplements.  She'll gain weight with an prescription for Adipex but if she cannot get it filled in May and February was read needs  rewritten for now.  She also complains of knee pain she has pain and tenderness on the right side in the lateral hamstring musculature and the patellar tendon plan is person taper steroids packs a normal monitor the petechiae thinks are nonpathologic.  Assessment & Plan    Diagnosis Orders   1. Petechiae  CBC Auto Differential    Comprehensive Metabolic Panel    Sedimentation Rate    C-Reactive Protein   2. Weight gain  phentermine (ADIPEX-P) 37.5 MG tablet     Orders Placed This Encounter   Procedures   . CBC Auto Differential     Standing Status:   Future     Standing Expiration Date:   06/03/2018   . Comprehensive Metabolic Panel     Standing Status:   Future     Standing Expiration Date:   06/03/2018   . Sedimentation Rate     Standing Status:   Future     Standing Expiration Date:   06/03/2018   . C-Reactive Protein     Standing Status:   Future     Standing Expiration Date:   06/03/2018     Orders Placed This Encounter   Medications   . phentermine (ADIPEX-P) 37.5 MG tablet     Sig: Take 1 tablet by mouth every morning (before breakfast) for 30 days.     Dispense:  30 tablet     Refill:  0     BMI 32.0   . predniSONE (DELTASONE) 10 MG tablet     Sig: Take 6 tabs x 6 days, then 5 x 1, 4 x 1, 3 x 1, 2 x 1, 1 x 1     Dispense:  51 tablet     Refill:  0     Medications Discontinued During This Encounter   Medication Reason   . Naproxen Sodium (ALEVE PO)      No follow-ups on file.        Controlled Substances Monitoring:     RX Monitoring 01/03/2015   Attestation The Prescription Monitoring Report for this patient was reviewed today.   Chronic Pain Routine Monitoring Possible medication side effects, risk of tolerance and/or dependence, and alternative treatments discussed;No signs of potential drug abuse or diversion identified.       Morton Amy, MD

## 2017-06-04 LAB — CBC WITH AUTO DIFFERENTIAL
Basophils %: 0.8 %
Basophils Absolute: 0.1 10*3/uL (ref 0.0–0.2)
Eosinophils %: 2.6 %
Eosinophils Absolute: 0.2 10*3/uL (ref 0.0–0.7)
Hematocrit: 37.7 % (ref 37.0–47.0)
Hemoglobin: 12.6 g/dL (ref 12.0–16.0)
Lymphocytes %: 40.5 %
Lymphocytes Absolute: 2.9 10*3/uL (ref 1.0–4.8)
MCH: 29.9 pg (ref 27.0–31.3)
MCHC: 33.5 % (ref 33.0–37.0)
MCV: 89.5 fL (ref 82.0–100.0)
Monocytes %: 7.8 %
Monocytes Absolute: 0.6 10*3/uL (ref 0.2–0.8)
Neutrophils %: 48.3 %
Neutrophils Absolute: 3.4 10*3/uL (ref 1.4–6.5)
Platelets: 243 10*3/uL (ref 130–400)
RBC: 4.22 M/uL (ref 4.20–5.40)
RDW: 13.3 % (ref 11.5–14.5)
WBC: 7.1 10*3/uL (ref 4.8–10.8)

## 2017-06-04 LAB — COMPREHENSIVE METABOLIC PANEL
ALT: 27 U/L (ref 0–33)
AST: 36 U/L — ABNORMAL HIGH (ref 0–35)
Albumin: 4.5 g/dL (ref 3.5–4.6)
Alkaline Phosphatase: 49 U/L (ref 40–130)
Anion Gap: 14 mEq/L (ref 9–15)
BUN: 17 mg/dL (ref 8–23)
CO2: 27 mEq/L (ref 20–31)
Calcium: 9.7 mg/dL (ref 8.5–9.9)
Chloride: 101 mEq/L (ref 95–107)
Creatinine: 0.94 mg/dL — ABNORMAL HIGH (ref 0.50–0.90)
GFR African American: >60 (ref >60)
GFR Non-African American: 59.9 — ABNORMAL LOW (ref >60)
Globulin: 3 g/dL (ref 2.3–3.5)
Glucose: 86 mg/dL (ref 70–99)
Potassium: 4.2 mEq/L (ref 3.4–4.9)
Sodium: 142 mEq/L (ref 135–144)
Total Bilirubin: 0.3 mg/dL (ref 0.2–0.7)
Total Protein: 7.5 g/dL (ref 6.3–8.0)

## 2017-06-04 LAB — SEDIMENTATION RATE: Sed Rate: 15 mm (ref 0–30)

## 2017-06-04 LAB — C-REACTIVE PROTEIN: CRP: 1 mg/L (ref 0.0–5.0)

## 2017-06-05 NOTE — Other (Signed)
Letter sent

## 2018-03-23 ENCOUNTER — Encounter: Payer: PRIVATE HEALTH INSURANCE | Attending: Internal Medicine | Primary: Family Medicine

## 2018-03-23 ENCOUNTER — Ambulatory Visit
Admit: 2018-03-23 | Discharge: 2018-03-23 | Payer: PRIVATE HEALTH INSURANCE | Attending: Internal Medicine | Primary: Family Medicine

## 2018-03-23 DIAGNOSIS — I10 Essential (primary) hypertension: Secondary | ICD-10-CM

## 2018-03-23 NOTE — Progress Notes (Signed)
Chief Complaint   Patient presents with   ??? Cardiac Clearance     RIGHT KNEE REPLACEMENT DR Kathyrn SheriffSTANFIELD 04/12/18   Chaperone for Intimate Exam    1. Was chaperone offered as part of the rooming process? offered, declined     [name]      02-07-14: Patient presents for initial medical evaluation. Patient is followed on a regular basis by Dr. Suann LarryFrank Hiti, MD. [facility] and would like to switch. Does have a hx of ASD that is being monitored clinically. 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 No hx of TIA or CVA. Does have some midsternal chest discomfort on and off. Feels like a squeezing/pressure type of pain, at rest, lasts for a few seconds/minutes, no radiation to jaw.  Pt denies dyspnea, dyspnea on exertion, change in exercise capacity, fatigue,  nausea, vomiting, diarrhea, constipation, motor weakness, insomnia, weight loss, syncope, dizziness, lightheadedness, palpitations, PND, orthopnea. 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 No hx of MI, CHF or arrhythmia. Last stress test was more than 5 yrs ago. No hx of LHC. No smoking hx. BP is normal. Father with hx of CABG at the age of 65.     03-23-14: s/p normal nuclear stress test with EF of 70%. Does have some palpitations. BP and HR are normal. Pt denies chest pain, dyspnea, dyspnea on exertion, change in exercise capacity, fatigue,  nausea, vomiting, diarrhea, constipation, motor weakness, insomnia, weight loss, syncope, dizziness, lightheadedness, palpitations, PND, orthopnea, or claudication.      09-28-14: did have a couple of episodes of LH/dizz, foggy, while at the store but no syncope. Pt denies chest pain, dyspnea, dyspnea on exertion, change in exercise capacity, fatigue,  nausea, vomiting, diarrhea, constipation, motor weakness, insomnia, weight loss, syncope,  PND,  orthopnea, or claudication. No recent illness or signs of dehydration. BP is good today. Recent lab work in 06/2014 shows mild renal insuff/pre renal azotemia. Compliant with meds. Some LE edema. Denies any TIA/CVA type symptoms.     03-23-18: needs pre op clearance for knee surgery. S/p EKG with NSR, first degree AVB, poor R wave progression, unchanged from 2015. She is completely asymptomatic. Pt denies chest pain, dyspnea, dyspnea on exertion, change in exercise capacity, fatigue,  nausea, vomiting, diarrhea, constipation, motor weakness, insomnia, weight loss, syncope, dizziness, lightheadedness, palpitations, PND, orthopnea, or claudication.   States that she's been told she had a "stroke" in her eye a while back. No hx of thromboembolic disease.       Patient Active Problem List   Diagnosis   ??? GERD (gastroesophageal reflux disease)   ??? Migraine headache   ??? Essential hypertension   ??? Atrial septal defect   ??? First degree AV block   ??? Family history of premature CAD   ??? Chronic pain syndrome   ??? Cervical spondylosis without myelopathy   ??? Pre-operative clearance       Past Surgical History:   Procedure Laterality Date   ??? APPENDECTOMY     ??? COLONOSCOPY  09/21/2009   ??? COLONOSCOPY  05/30/13    DR LISI   ??? COLONOSCOPY  08/28/2016    DR LISI,   5 YRS   ??? HYSTERECTOMY      partial   ??? TONSILLECTOMY     ??? UPPER  GASTROINTESTINAL ENDOSCOPY  09/21/2009       Social History     Socioeconomic History   ??? Marital status: Married     Spouse name: Chely Gambell   ??? Number of children: 2   ??? Years of education: Not on file   ??? Highest education level: Not on file   Occupational History     Employer: [employer]   Social Needs   ??? Financial resource strain: Not on file   ??? Food insecurity:     Worry: Not on file     Inability: Not on file   ??? Transportation needs:     Medical: Not on file     Non-medical: Not on file   Tobacco Use   ??? Smoking status: Never Smoker   ??? Smokeless tobacco: Never Used   Substance  and Sexual Activity   ??? Alcohol use: No     Alcohol/week: 0.0 standard drinks   ??? Drug use: No   ??? Sexual activity: Not on file   Lifestyle   ??? Physical activity:     Days per week: Not on file     Minutes per session: Not on file   ??? Stress: Not on file   Relationships   ??? Social connections:     Talks on phone: Not on file     Gets together: Not on file     Attends religious service: Not on file     Active member of club or organization: Not on file     Attends meetings of clubs or organizations: Not on file     Relationship status: Not on file   ??? Intimate partner violence:     Fear of current or ex partner: Not on file     Emotionally abused: Not on file     Physically abused: Not on file     Forced sexual activity: Not on file   Other Topics Concern   ??? Not on file   Social History Narrative   ??? Not on file       Family History   Problem Relation Age of Onset   ??? Cancer Mother         breast   ??? Heart Disease Father    ??? Cancer Father         colon       Current Outpatient Medications   Medication Sig Dispense Refill   ??? lansoprazole (PREVACID) 30 MG delayed release capsule TAKE 1 CAPSULE BY MOUTH ONCE DAILY 90 capsule 3   ??? SUMAtriptan (IMITREX) 100 MG tablet TAKE 1 TABLET BY MOUTH AS NEEDED FOR MIGRAINE 18 tablet 3   ??? potassium chloride (KLOR-CON M10) 10 MEQ extended release tablet TAKE 1 TABLET BY MOUTH ONCE DAILY 90 tablet 3   ??? cyclobenzaprine (FLEXERIL) 10 MG tablet TAKE 1 TABLET BY MOUTH THREE TIMES DAILY AS NEEDED FOR MUSCLE SPASMS 180 tablet 3   ??? diclofenac (VOLTAREN) 75 MG EC tablet TAKE ONE TABLET BY MOUTH TWICE DAILY 180 tablet 3   ??? PREMARIN 0.3 MG tablet TAKE ONE TABLET BY MOUTH ONCE DAILY 90 tablet 0   ??? atenolol-chlorthalidone (TENORETIC) 50-25 MG per tablet TAKE 1 TABLET BY MOUTH ONCE DAILY 30 tablet 5   ??? albuterol sulfate HFA 108 (90 Base) MCG/ACT inhaler Inhale 2 puffs into the lungs every 4 hours as needed for Wheezing or Shortness of Breath 1 Inhaler 0   ??? aspirin EC 81 MG  EC tablet Take  1 tablet by mouth daily 30 tablet 3   ??? Multiple Vitamin (MULTIVITAMIN PO) Take  by mouth.         No current facility-administered medications for this visit.        Patient has no known allergies.    Review of Systems:  General ROS: negative  Psychological ROS: negative  Hematological and Lymphatic ROS: No history of blood clots or bleeding disorder.   Respiratory ROS: no cough, shortness of breath, or wheezing  Cardiovascular ROS: no chest pain or dyspnea on exertion  Gastrointestinal ROS: no abdominal pain, change in bowel habits, or black or bloody stools  Genito-Urinary ROS: no dysuria, trouble voiding, or hematuria  Musculoskeletal ROS: negative  Neurological ROS: no TIA or stroke symptoms  Dermatological ROS: negative    VITALS:  Blood pressure 110/70, pulse 69, weight 174 lb (78.9 kg), last menstrual period 05/29/2003, SpO2 98 %, not currently breastfeeding.  Body mass index is 31.83 kg/m??.    Physical Examination:  General appearance - alert, well appearing, and in no distress  Mental status - alert, oriented to person, place, and time  Neck - Neck is supple, no JVD or carotid bruits.  No thyromegaly or adenopathy.   Chest - clear to auscultation, no wheezes, rales or rhonchi, symmetric air entry  Heart - normal rate, regular rhythm, normal S1, S2, _+ murmurs, rubs, clicks or gallops  Abdomen - soft, nontender, nondistended, no masses or organomegaly  Neurological - alert, oriented, normal speech, no focal findings or movement disorder noted  Extremities - peripheral pulses normal, no pedal edema, no clubbing or cyanosis  Skin - normal coloration and turgor, no rashes, no suspicious skin lesions noted      No orders of the defined types were placed in this encounter.      ASSESSMENT:     Diagnosis Orders   1. Essential hypertension     2. First degree AV block     3. Atrial septal defect     4. Pre-operative clearance           PLAN:     Patient will need to continue to follow up with you for their general  medical care     As always, aggressive risk factor modification is strongly recommended. We should adhere to the JNC VII guidelines for HTN management and the NCEP ATP III guidelines for LDL-C management.    Cardiac diet is always recommended with low fat, cholesterol, calories and sodium.    Continue medications at current doses.     Will check ECHO  for PFO/IAS    Patient was advised and encouraged to check blood pressure at home or at a pharmacy, maintain a logbook, and also call us back if blood pressure are above the target ranges or if it is low. Patient clearly understands and agrees to the instructions.     We will need to continue to monitor muscle and liver enzymes, BUN, CR, and electrolytes.    Details of medical condition explained and patient was warned about adverse consequences of uncontrolled medical conditions and possible side effects of prescribed medications.     Patient was advised to go to the ER if he starts experiencing adverse effects of the medications.patient was instructed to call us back or go to nearby emergency room immediately if symptoms get worse or do not improve.    Thank you for allowing me to participate in the care of your patient, please  don't hesitate to contact me if you have any further questions.

## 2018-05-19 ENCOUNTER — Telehealth

## 2018-05-19 NOTE — Telephone Encounter (Signed)
-----   Message from Carolee Rota, [place] sent at 03/23/2018  2:04 PM EST -----  Regarding: ECHO  Per DR Harrel Lemon notify of Echo from [facility]

## 2018-05-19 NOTE — Telephone Encounter (Signed)
Results scanned. Please review

## 2018-05-21 NOTE — Telephone Encounter (Signed)
LMOM TO CALL BACK

## 2018-05-21 NOTE — Telephone Encounter (Signed)
PATIENT AWARE    PATIENT REQUESTED TO WAIT UNTIL AFTER SURGERY TO SCHEDULE A F/U APPT

## 2018-05-21 NOTE — Telephone Encounter (Signed)
Echo looks good. No mention of ASD or PFO, no bubble study was performed.     Ok for surgery.    Please have her make an appointment to see me in 6 months. I dont see anything scheduled.

## 2019-03-10 ENCOUNTER — Institutional Professional Consult (permissible substitution): Admit: 2019-03-10 | Payer: PRIVATE HEALTH INSURANCE | Attending: Interventional Pain Medicine | Primary: Family Medicine

## 2019-03-10 DIAGNOSIS — M47817 Spondylosis without myelopathy or radiculopathy, lumbosacral region: Secondary | ICD-10-CM

## 2021-06-07 IMAGING — MR MRI LEFT HIP WITHOUT CONTRAST
4 of 6 series · 23 of 40 positions shown · IV contrast (gadolinium)
Comparison: None

________________________________________________________________________________________________ 
MRI LEFT HIP WITHOUT CONTRAST, 06/07/2021 [DATE]: 
CLINICAL INDICATION: Piriformis syndrome. Left lateral hip pain. Left bursectomy 
in December 2020.
TECHNIQUE: Multiplanar, multiecho position MR images of the hip were performed 
without intravenous gadolinium enhancement. Large field-of-view images were 
performed of the pelvis to include the contralateral hip for comparison. Patient 
was scanned on a 1.5T magnet.

[Series 301: survey · axial · 10.0mm · 1.14mm/px · z∈[-87,+135]mm · 2 of 11 slices shown]
[im 1/11]
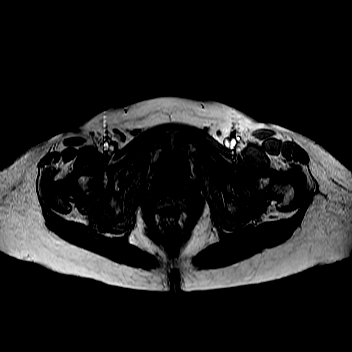
[im 11/11]
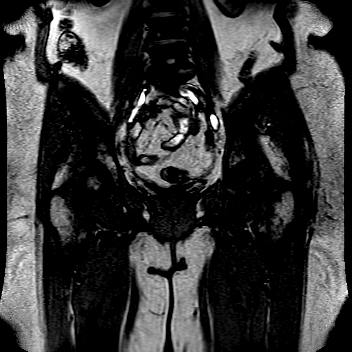

[Series 401: stir_cor-pelvis · coronal · 5.0mm · 0.78mm/px · 8 of 30 slices shown]
[im 1/30]
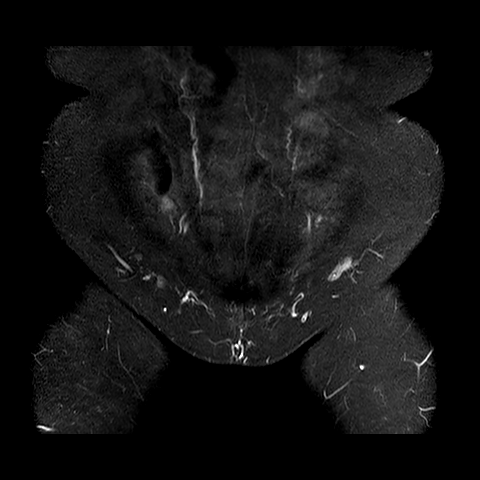
[im 5/30]
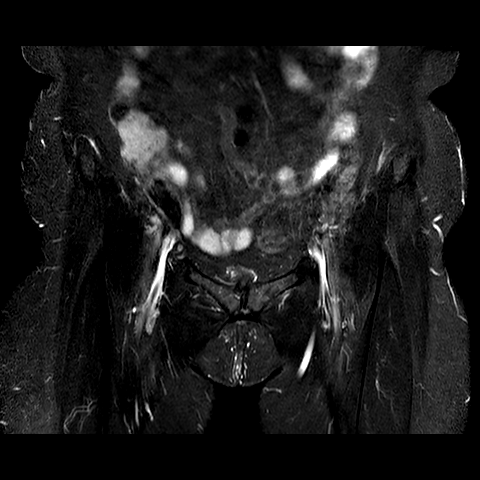
[im 9/30]
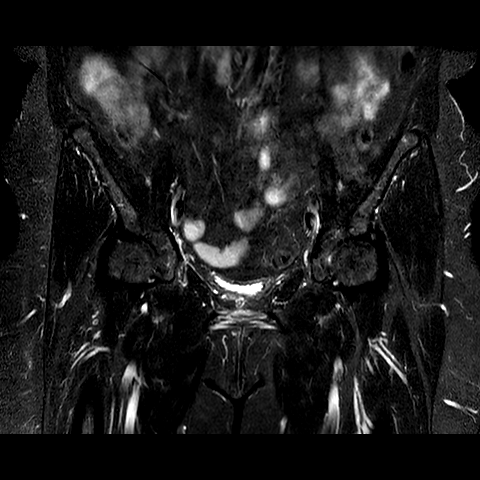
[im 13/30]
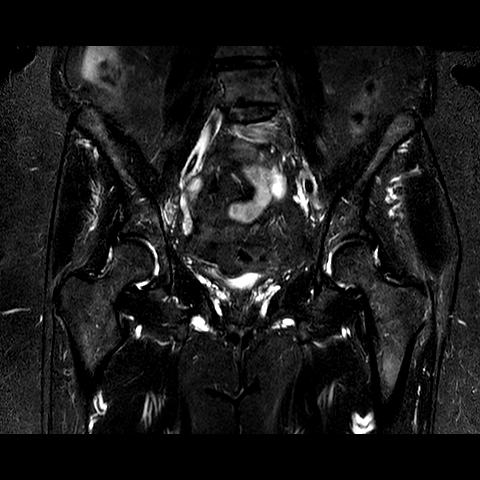
[im 17/30]
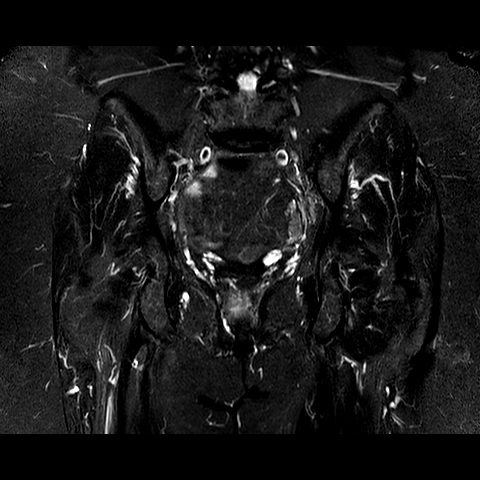
[im 21/30]
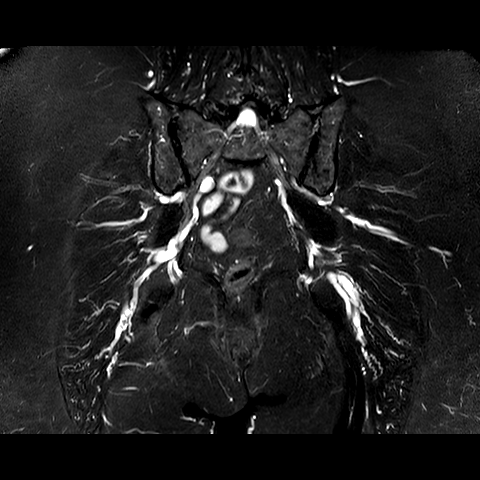
[im 25/30]
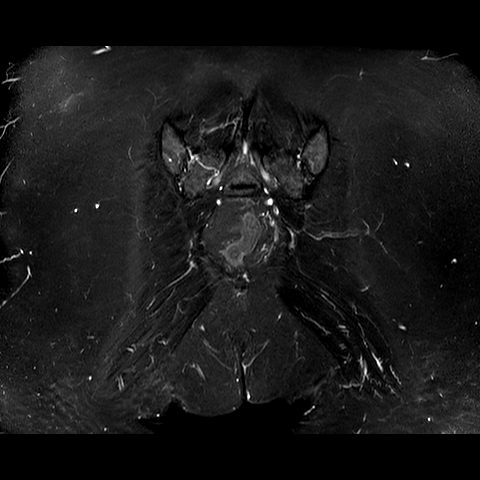
[im 30/30]
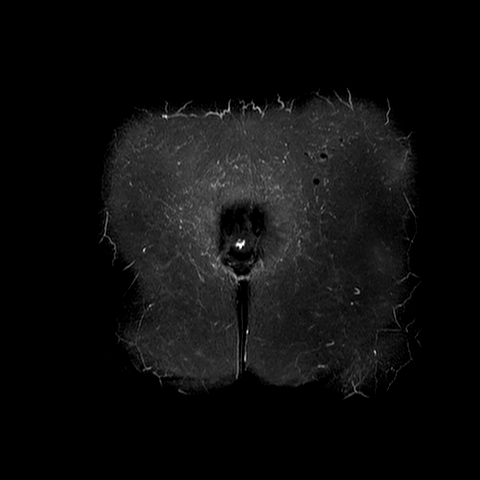

[Series 501: t1_(person_name) · axial · 6.0mm · 0.78mm/px · z∈[-222,+90]mm · 8 of 40 slices shown]
[im 1/40]
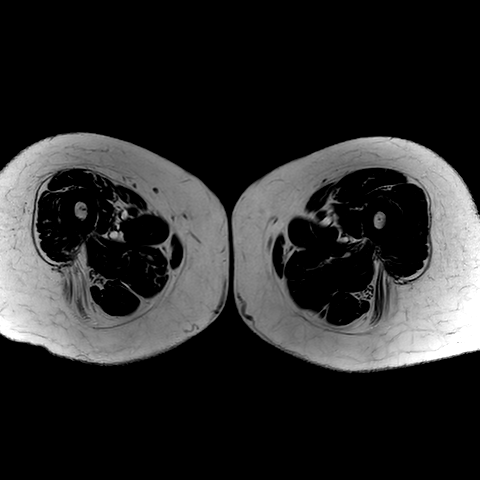
[im 5/40]
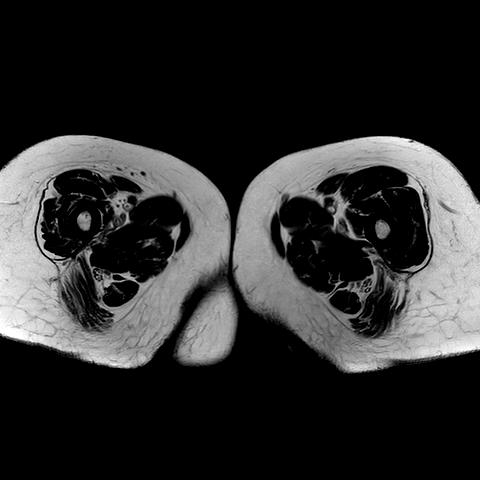
[im 14/40]
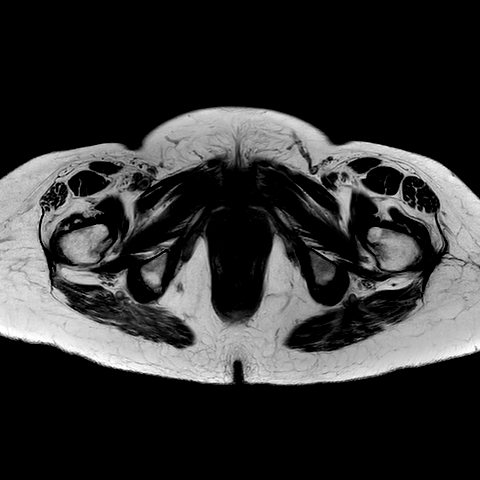
[im 18/40]
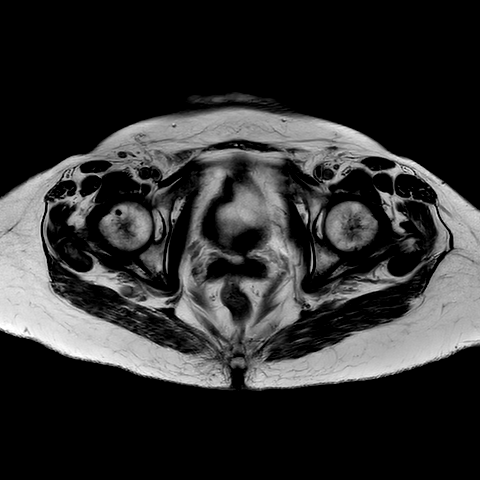
[im 22/40]
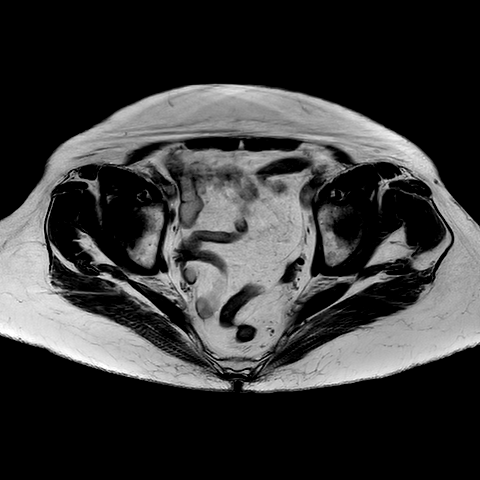
[im 27/40]
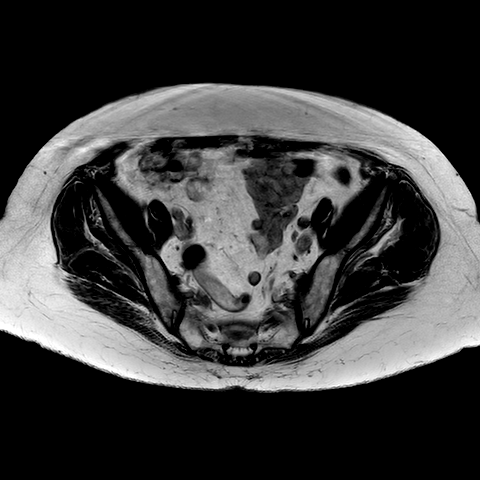
[im 35/40]
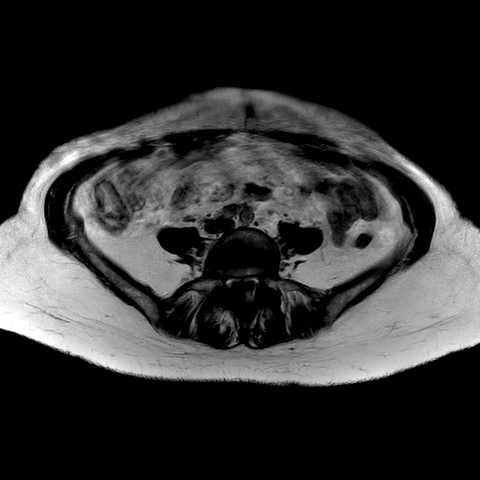
[im 40/40]
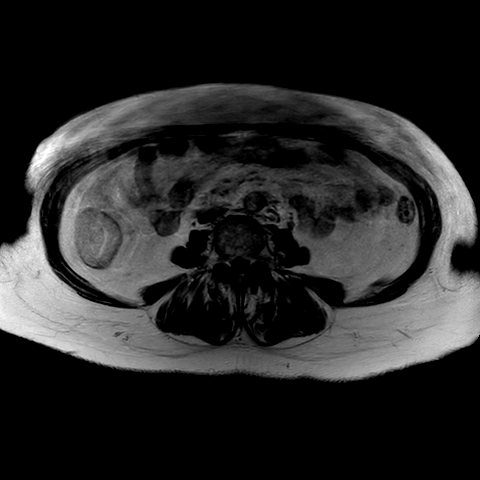

[Series 601: (person_name)_(person_name)_(person_name)_ · axial · 4.0mm · 0.47mm/px · z∈[-149,-39]mm · 5 of 28 slices shown]
[im 1/28]
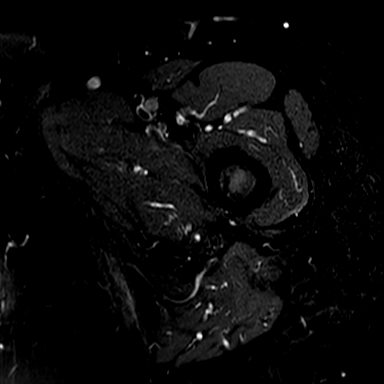
[im 5/28]
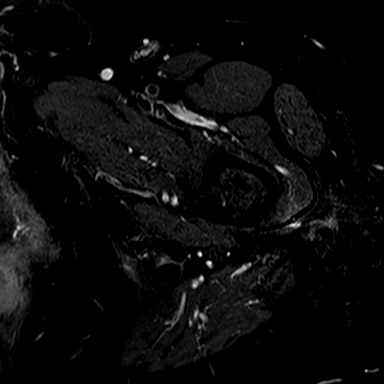
[im 10/28]
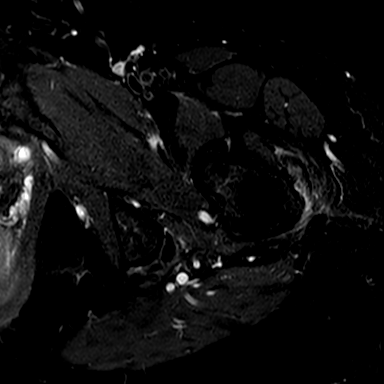
[im 14/28]
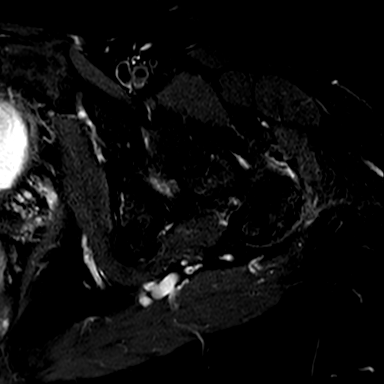
[im 23/28]
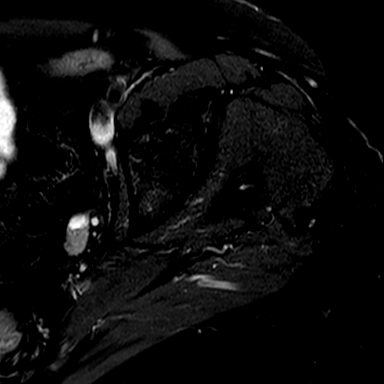

[23 of 40 positions shown; findings below may reference images not displayed]

FINDINGS: HIPS: Mild degenerative change of the left hip with partial thickness 
chondromalacia. Degenerative anterior labral tear. No discrete articular 
cartilaginous loss of the right hip. No paralabral cyst. No hip joint effusion. 
Both femoral heads maintain a spherical configuration without evidence of 
avascular necrosis or subarticular collapse. No abnormal morphology of the 
proximal femurs or acetabulum to predispose to impingement.  
BONES: Normal marrow signal intensity of the proximal hips, pelvis, sacrum and 
included lower lumbar spine. No fracture, contusion or marrow replacing lesion. 
Included lumbar spine demonstrates multilevel degenerative change. Degenerative 
changes of the left SI joint. 
SOFT TISSUES: Normal caliber and signal intensity of the piriformis muscles. 
History of left greater trochanteric bursectomy with lateral linear scar and 
trace amount of peritrochanteric fluid. Bilateral gluteal tendinosis and small 
amount of left peritendinous fluid. The insertions of the iliopsoas tendons are 
intact. The origins of the hamstrings are preserved. Rectus abdominis-adductor 
complex is preserved. The musculature is symmetric without strain, atrophy or 
mass. Included neurovascular bundles are negative. Hysterectomy.
IMPRESSION: 1.  Normal piriformis muscle caliber and signal intensity.  
2.  History of left greater trochanteric bursectomy with trace amount of 
peritrochanteric fluid.  
3.  Bilateral gluteal tendinosis and small amount of left peritendinous fluid.  
4.  Mild degenerative change of the left hip and degenerative anterior labral 
tear.  
5.  Degenerative change of the left SI joint and spine. 
6.  Hysterectomy.

## 2021-11-15 IMAGING — DX KNEE 3 VIEWS RIGHT
2 series · 3 of 3 positions shown · non-contrast
Comparison: None.

________________________________________________________________________________________________ 
KNEE 3 VIEWS RIGHT, 11/15/2021 [DATE]: 
CLINICAL INDICATION: Osteoarthritis Of Knee, Unspecified.

[Series 1: AP · 0.14mm/px · 2 of 2 slices shown]
[im 1/2]
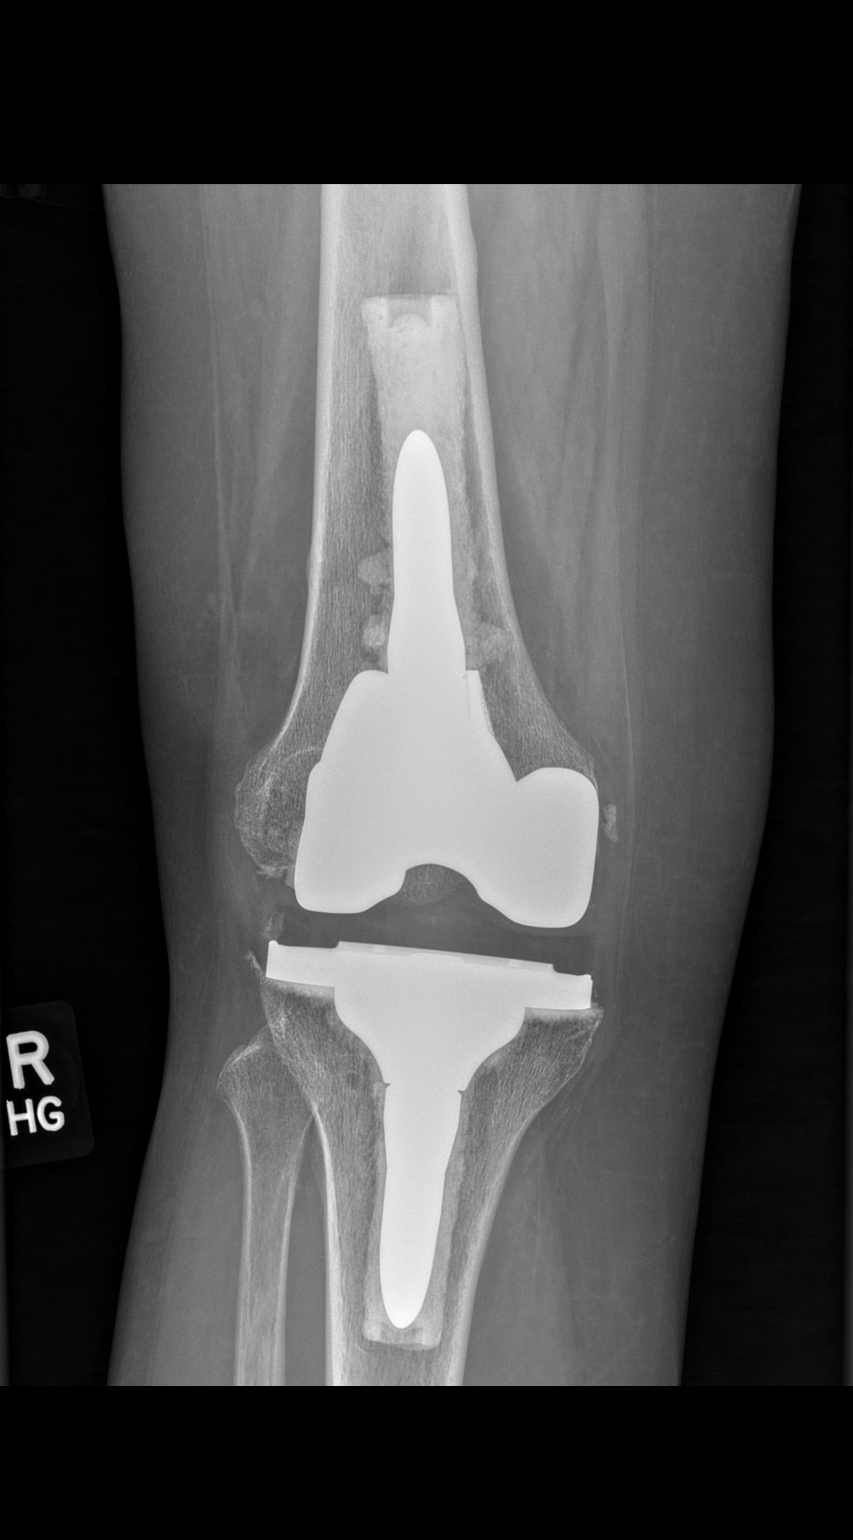
[im 2/2]
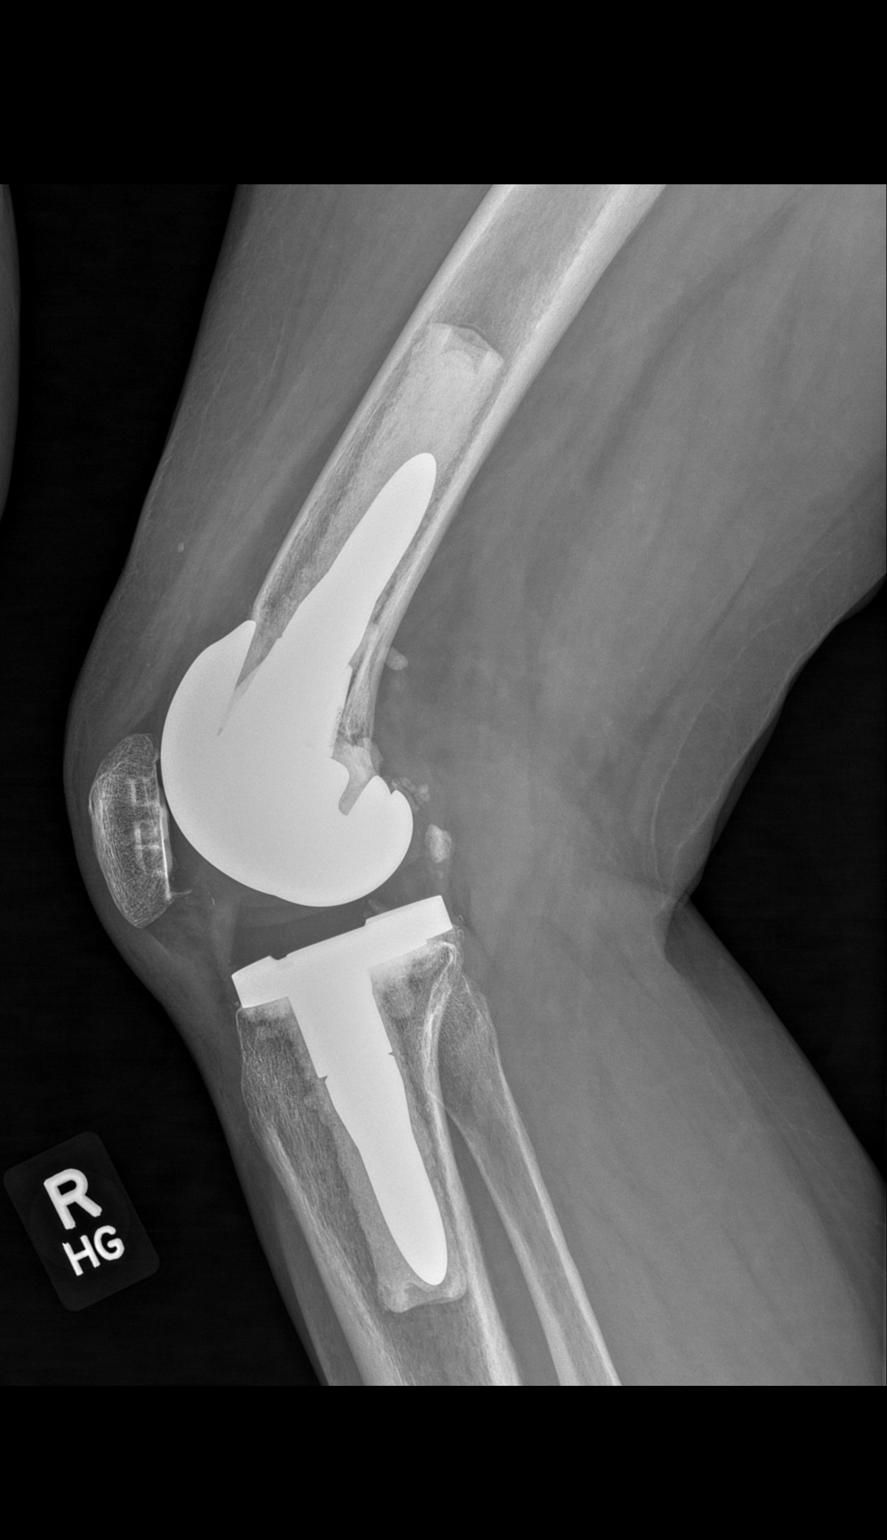

[sky line]
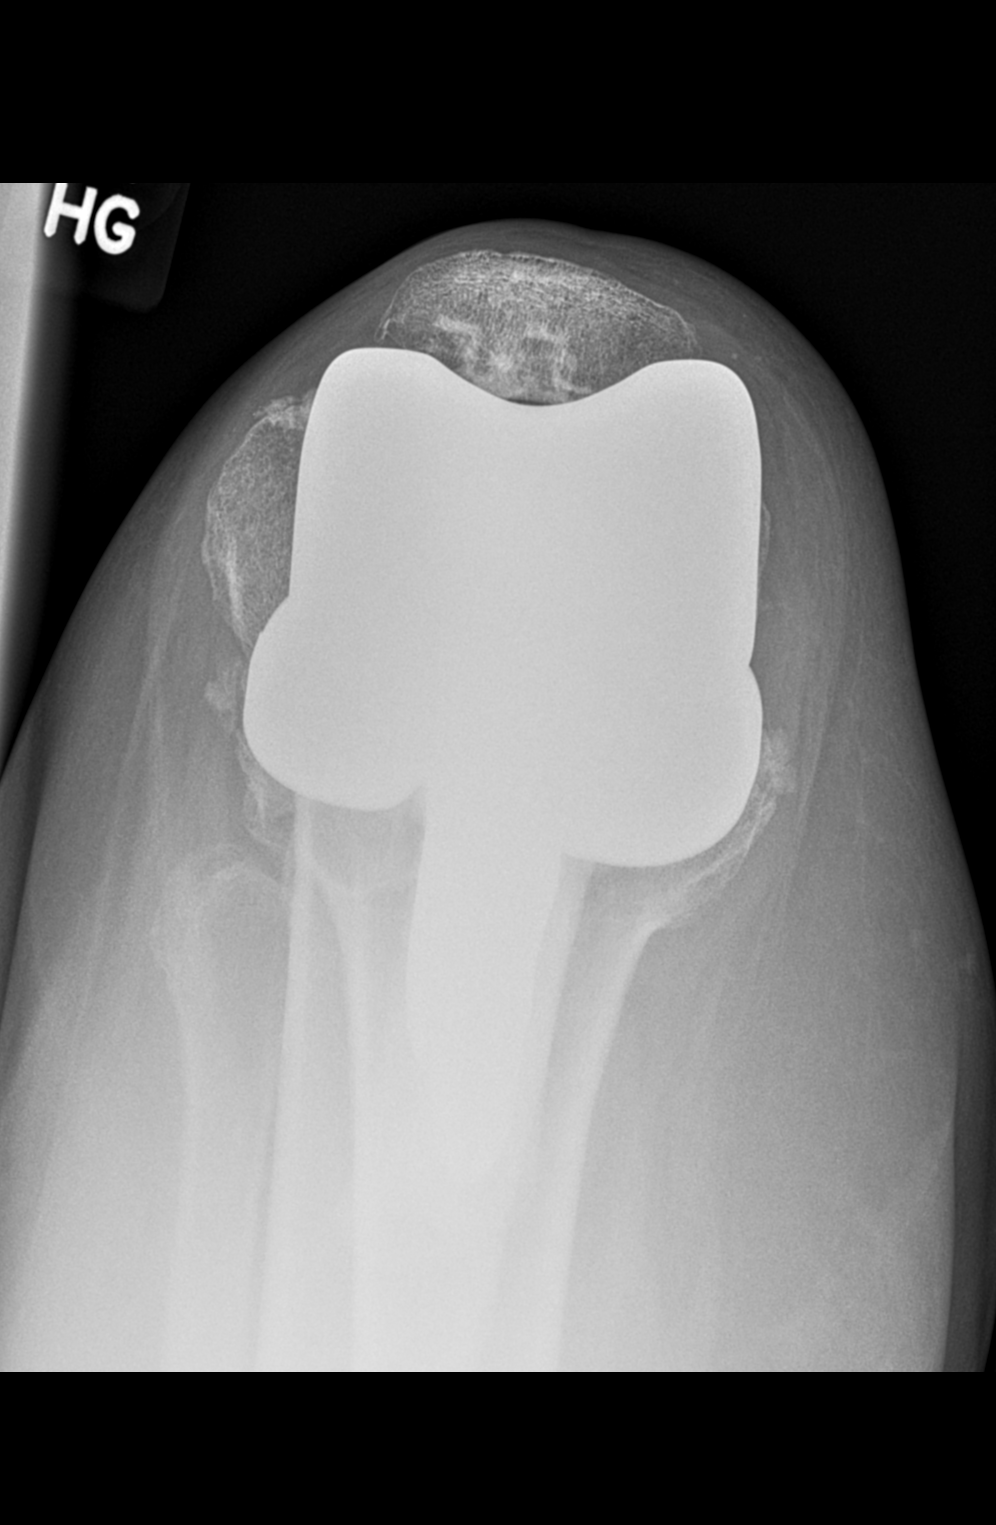

[3 of 3 positions shown; findings below may reference images not displayed]

FINDINGS: No fracture. Revision cemented total knee arthroplasty is well 
seated. No loosening or periprosthetic fracture. Small intra-articular bodies 
and grade 1 heterotopic ossification about the knee. No effusion. Normal bone 
mineralization.
IMPRESSION: Revision cemented total knee arthroplasty is well seated.

## 2022-07-22 IMAGING — MR MRI LEFT HIP WITHOUT CONTRAST
5 of 6 series · 23 of 40 positions shown · IV contrast (gadolinium)
Comparison: 06/07/2021

________________________________________________________________________________________________ 
MRI LEFT HIP WITHOUT CONTRAST, 07/22/2022 [DATE]: 
CLINICAL INDICATION: Unilateral primary osteoarthritis, left hip. History of 
left greater trochanteric bursectomy.
TECHNIQUE: Multiplanar, multiecho position MR images of the pelvis and left hip 
were performed without intravenous gadolinium enhancement. Small field-of-view 
imaging was performed of the hip.

[Series 201: survey · axial · 10.0mm · 1.17mm/px · z∈[-33,+187]mm · 2 of 10 slices shown]
[im 1/10]
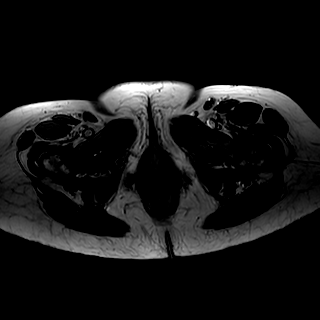
[im 10/10]
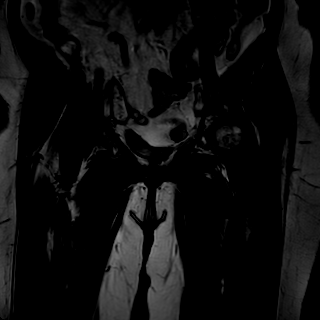

[Series 301: (person_name)_(person_name)_(person_name)_ · axial · 4.0mm · 0.42mm/px · z∈[-56,+79]mm · 7 of 28 slices shown]
[im 1/28]
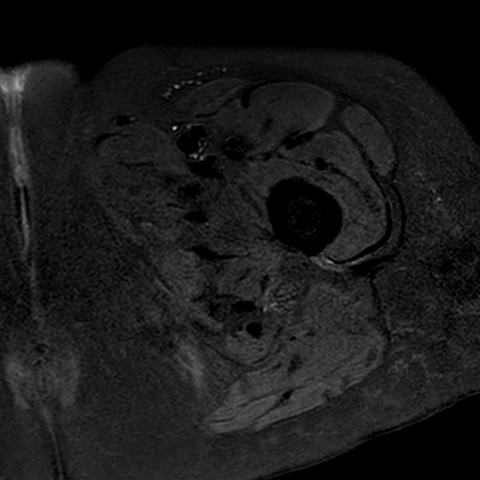
[im 5/28]
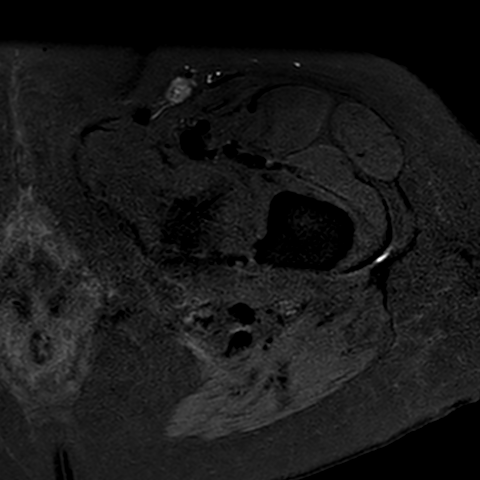
[im 10/28]
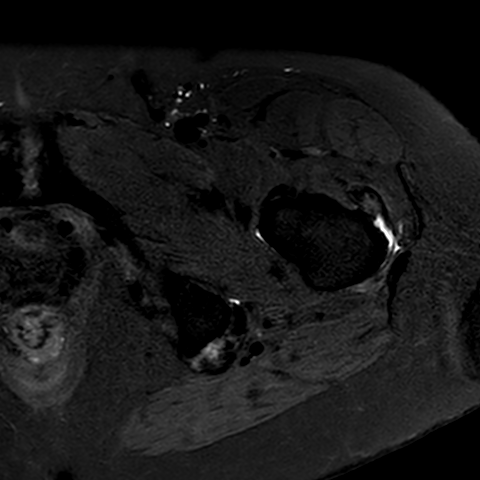
[im 14/28]
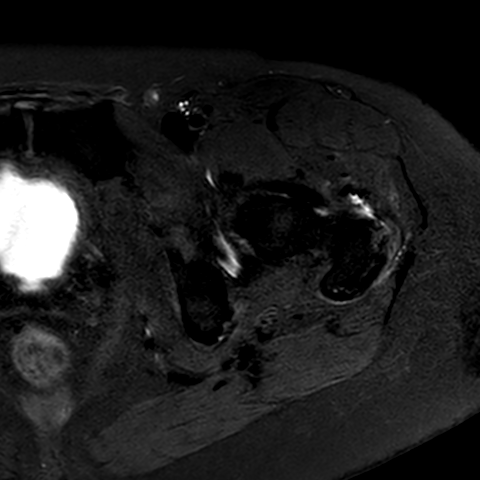
[im 19/28]
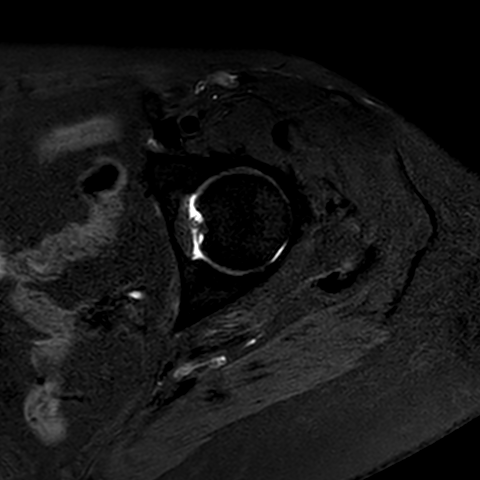
[im 23/28]
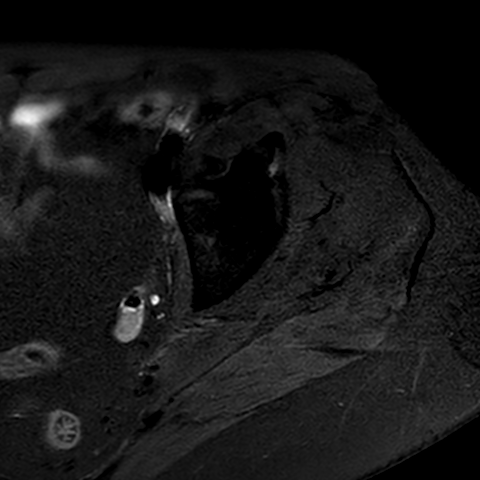
[im 28/28]
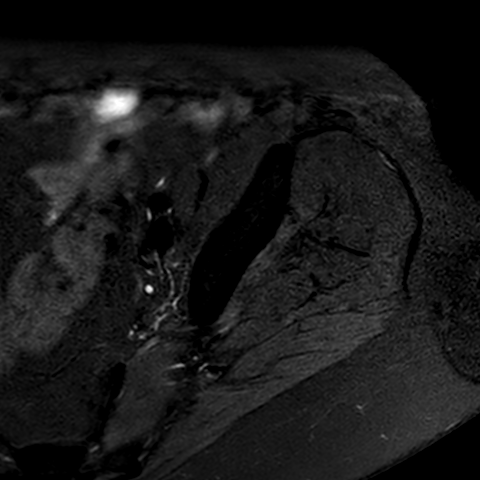

[Series 401: pd_fs_sag fh · sagittal · 4.0mm · 0.55mm/px · 8 of 32 slices shown]
[im 1/32]
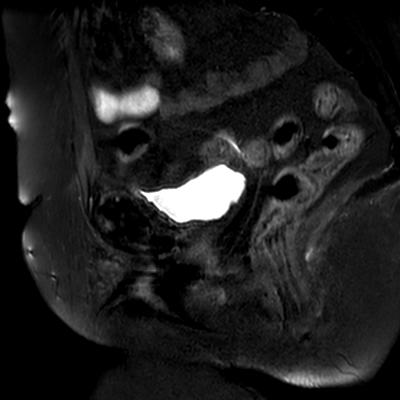
[im 5/32]
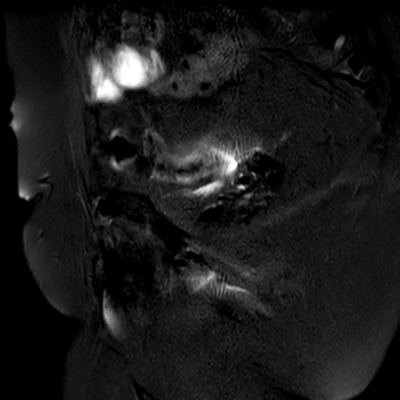
[im 9/32]
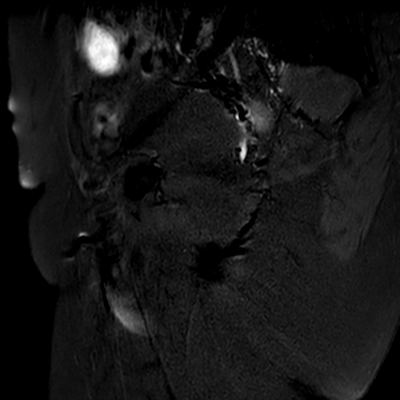
[im 14/32]
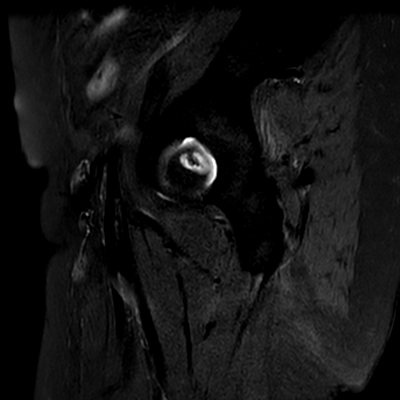
[im 18/32]
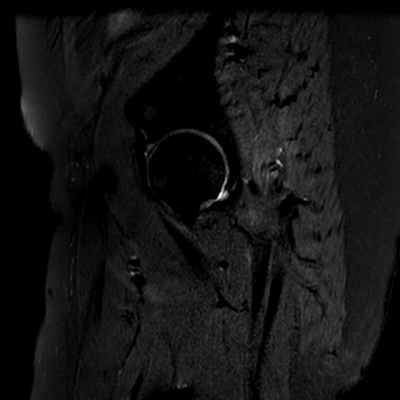
[im 23/32]
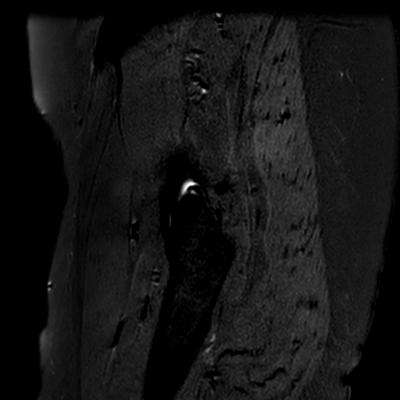
[im 27/32]
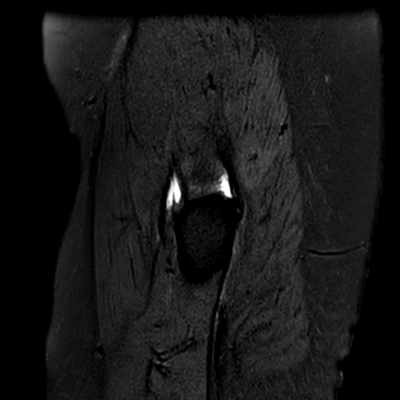
[im 32/32]
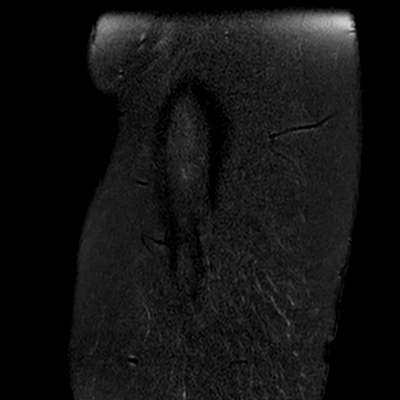

[Series 501: pd_fs_cor · coronal · 4.0mm · 0.49mm/px · 5 of 20 slices shown]
[im 1/20]
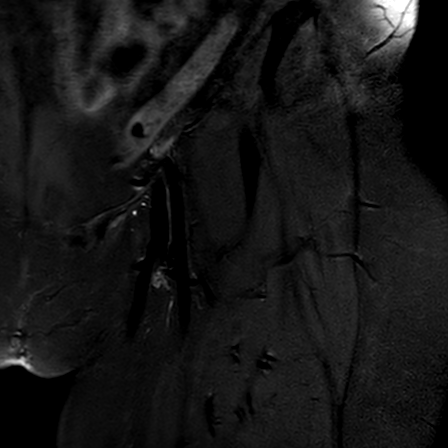
[im 5/20]
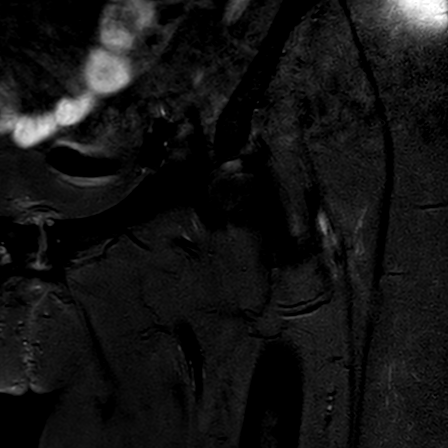
[im 10/20]
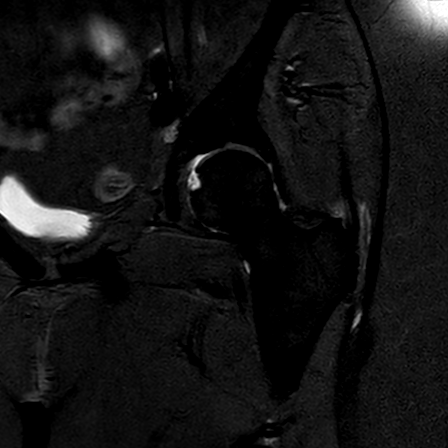
[im 15/20]
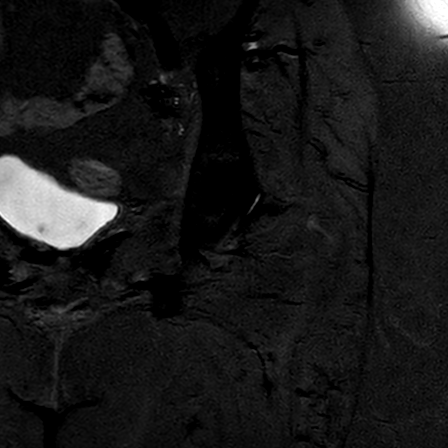
[im 20/20]
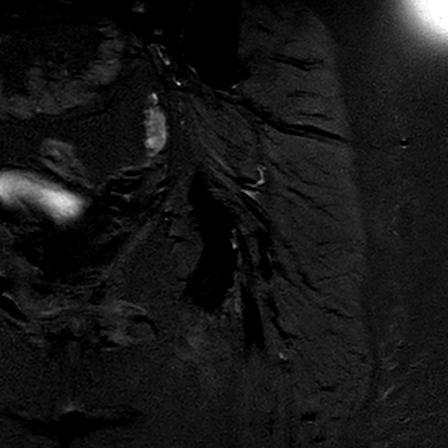

[Series 601: stir_cor-pelvis · coronal · 5.0mm · 0.62mm/px · 1 of 30 slices shown]
[im 1/30]
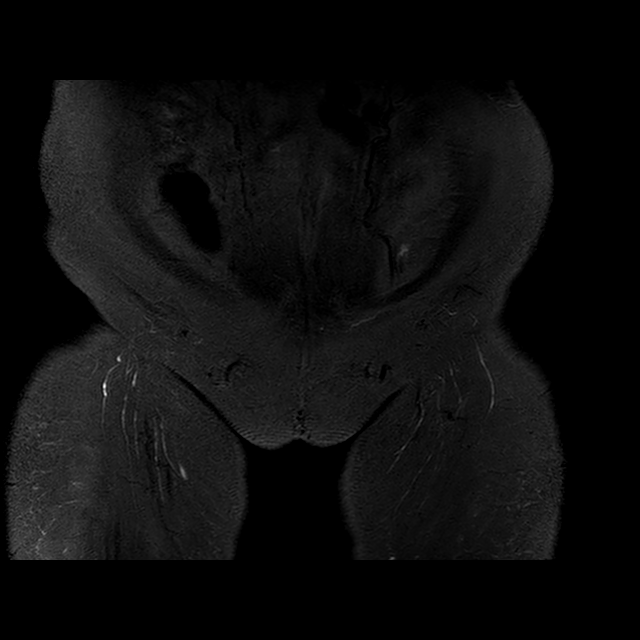

[23 of 40 positions shown; findings below may reference images not displayed]

FINDINGS: HIPS: Mild degenerative change of the left hip with partial thickness 
chondromalacia and partial superior chondrolabral detachment. Degenerative 
anterior/superior labral tears. Large field-of-view imaging demonstrates right 
hip partial thickness chondromalacia. No paralabral cyst. No hip joint effusion. 
Both femoral heads maintain a spherical configuration without evidence of 
avascular necrosis or subarticular collapse. No abnormal morphology of the 
proximal femurs or acetabulum to predispose to impingement.  
BONES: Normal marrow signal intensity of the proximal hips, pelvis, sacrum and 
included lower lumbar spine. No fracture, contusion or marrow replacing lesion. 
Included lumbar spine demonstrates multilevel degenerative change. Degenerative 
changes of the SI joints. Moderate degenerative change of the pubic symphysis. 
SOFT TISSUES: Partial thickness tears and tendinosis of the left gluteus 
medius/minimus tendons with small amount of peritendinous/peritrochanteric 
fluid. History of left greater trochanteric bursectomy with minimal postsurgical 
susceptibility artifact and trace amount of peritrochanteric fluid. The 
insertions of the iliopsoas tendons are intact. The origins of the hamstrings 
are preserved. Rectus abdominis-adductor complex is preserved. The musculature 
is symmetric without strain, atrophy or mass. Included neurovascular bundles are 
negative. Tiny fat-containing umbilical hernia. Hysterectomy.
IMPRESSION: 1.  Mild degenerative change of the left hip and degenerative anterior/superior 
labral tears.  
2.  Partial thickness tears and tendinosis of the left gluteus medius/minimus 
tendons and small amount of left peritendinous fluid.  
3.  History of left greater trochanteric bursectomy with trace amount of 
peritrochanteric fluid.  
4.  Degenerative change of the SI joints, pubic symphysis and spine. 
5.  Tiny fat-containing umbilical hernia.  
6.  Hysterectomy.

## 2023-07-08 IMAGING — DX KNEE 3 VIEWS RIGHT
1 series · 3 of 3 positions shown · non-contrast
Comparison: 11/15/2021

________________________________________________________________________________________________ 
KNEE 3 VIEWS RIGHT, 07/08/2023 [DATE]: 
CLINICAL INDICATION: Unilateral Primary Osteoarthritis, Right Knee

[Series 1: lateral · 0.14mm/px · 3 of 3 slices shown]
[im 1/3]
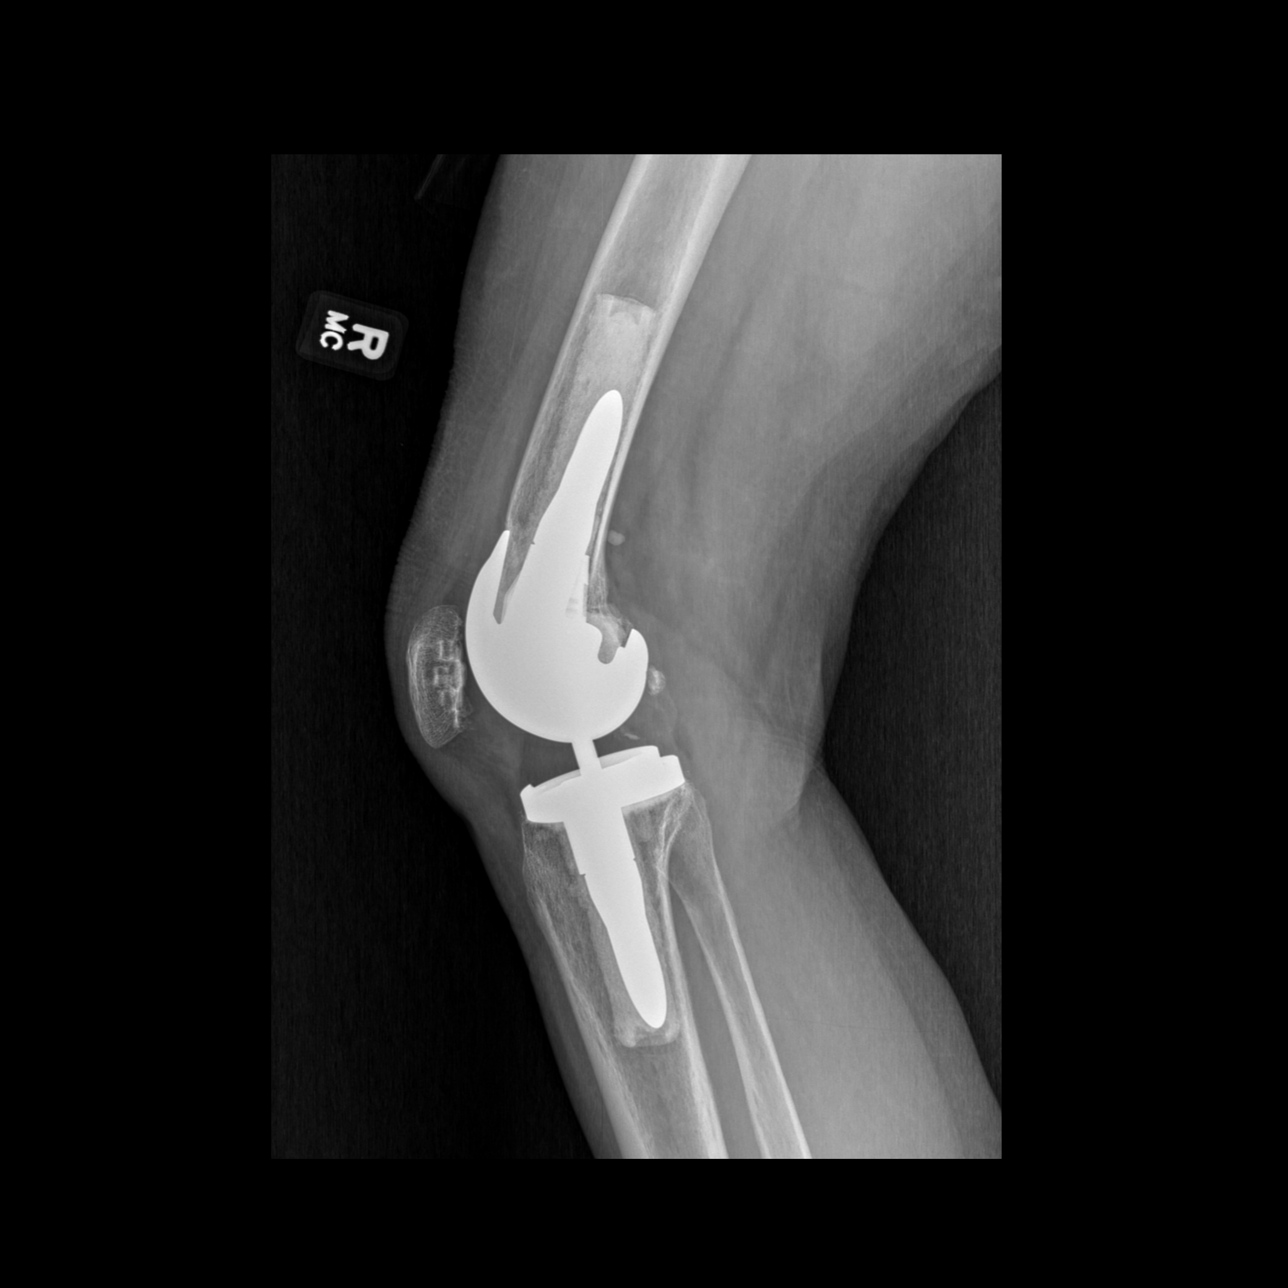
[im 2/3]
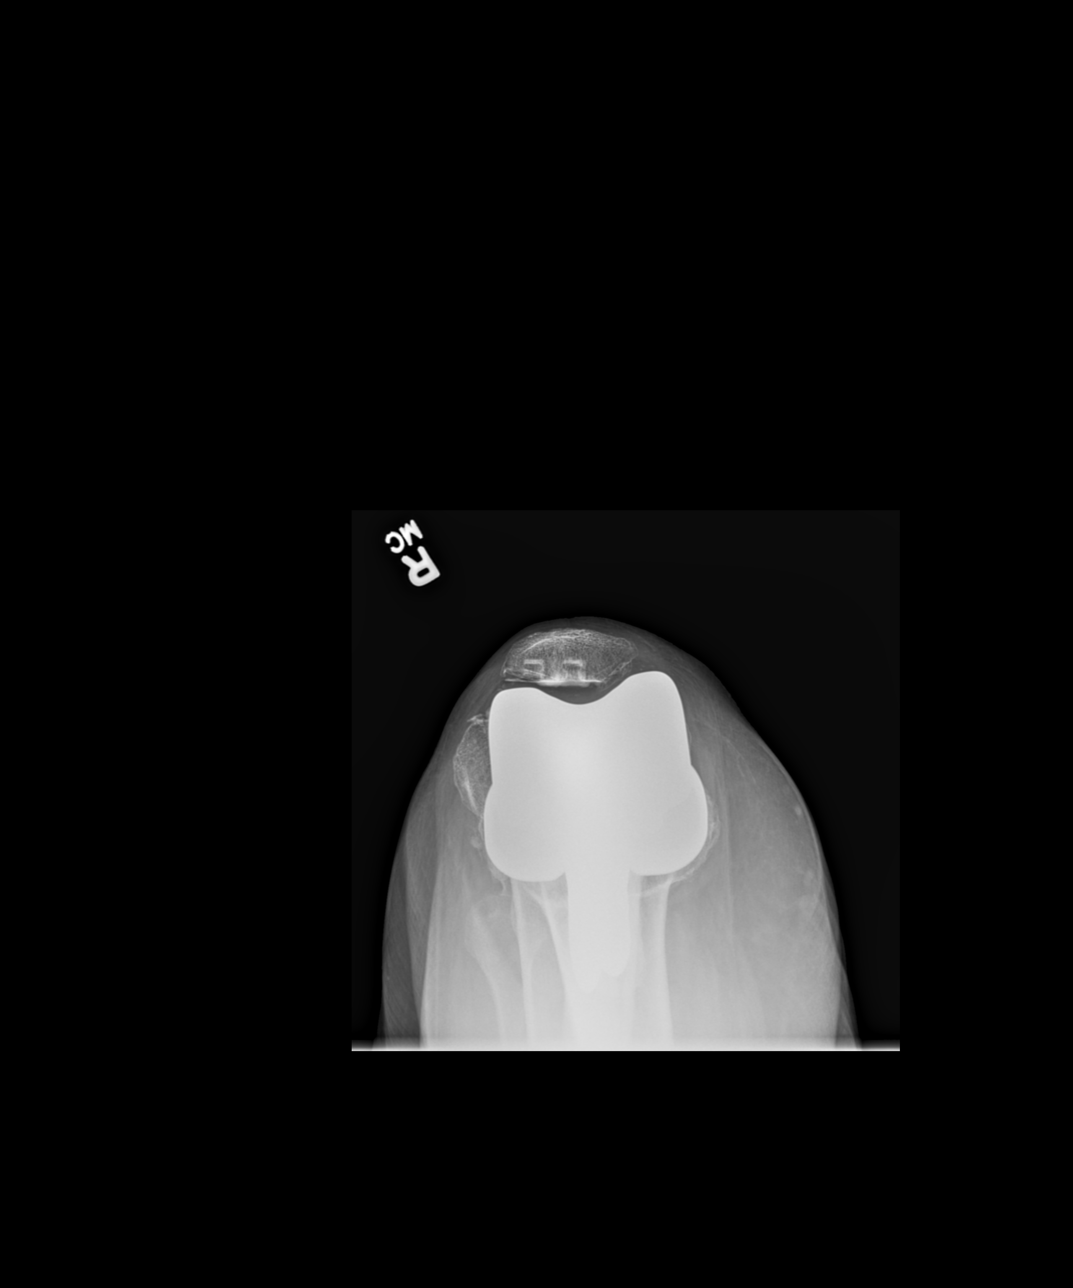
[im 3/3]
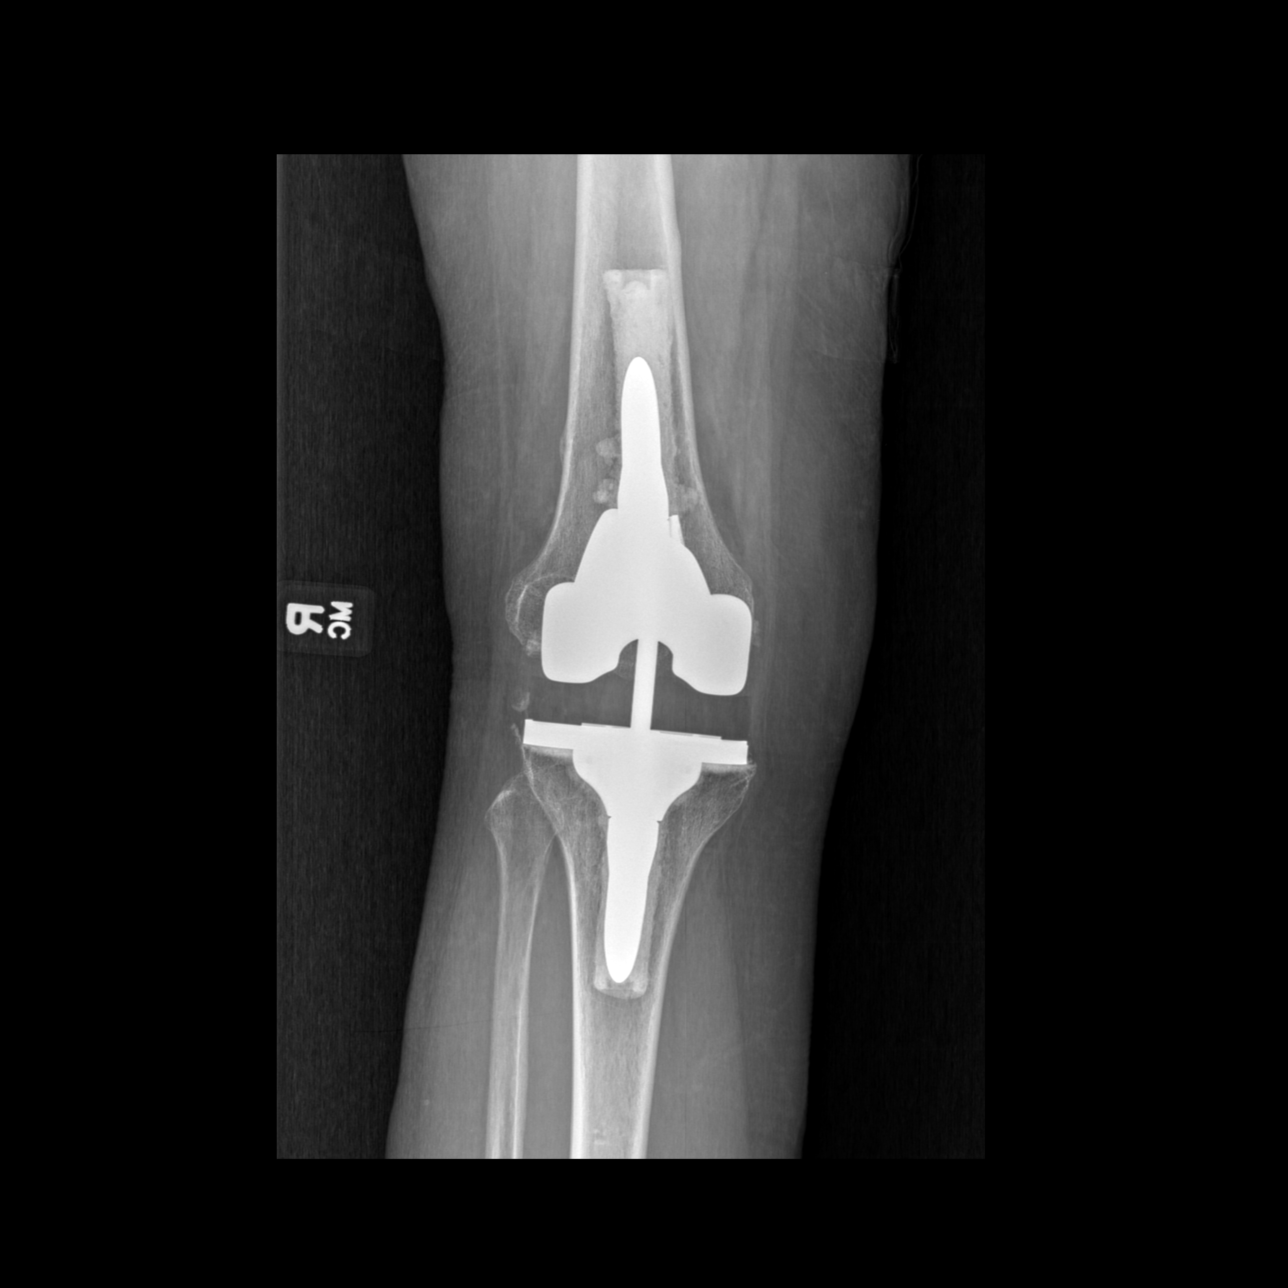

[3 of 3 positions shown; findings below may reference images not displayed]

FINDINGS: Stable revision cemented total knee arthroplasty (TKA) with thick 
polyethylene spacer is well seated. Small foci of heterotopic ossification about 
the knee. No fracture. No effusion. Normal bone mineralization.
IMPRESSION: Revision cemented total knee arthroplasty is well seated.
